# Patient Record
Sex: Male | Born: 1965 | Race: White | Hispanic: No | Marital: Single | Smoking: Never smoker
Health system: Southern US, Community
[De-identification: ages and names within clinical notes are randomized; demographics above are authoritative.]

## PROBLEM LIST (undated history)

## (undated) DIAGNOSIS — R079 Chest pain, unspecified: Secondary | ICD-10-CM

## (undated) DIAGNOSIS — J45909 Unspecified asthma, uncomplicated: Secondary | ICD-10-CM

## (undated) HISTORY — DX: Chest pain, unspecified: R07.9

## (undated) HISTORY — PX: OTHER SURGICAL HISTORY: SHX169

---

## 1999-08-17 ENCOUNTER — Encounter: Payer: Self-pay | Admitting: Family Medicine

## 1999-08-17 ENCOUNTER — Ambulatory Visit (HOSPITAL_COMMUNITY): Admission: RE | Admit: 1999-08-17 | Discharge: 1999-08-17 | Payer: Self-pay | Admitting: Family Medicine

## 2008-10-01 ENCOUNTER — Emergency Department (HOSPITAL_COMMUNITY): Admission: EM | Admit: 2008-10-01 | Discharge: 2008-10-01 | Payer: Self-pay | Admitting: Emergency Medicine

## 2010-11-08 LAB — COMPREHENSIVE METABOLIC PANEL
ALT: 44 U/L (ref 0–53)
AST: 32 U/L (ref 0–37)
CO2: 26 mEq/L (ref 19–32)
Chloride: 107 mEq/L (ref 96–112)
GFR calc Af Amer: >60 mL/min (ref >60)
GFR calc non Af Amer: >60 mL/min (ref >60)
Glucose, Bld: 134 mg/dL — ABNORMAL HIGH (ref 70–99)
Sodium: 142 mEq/L (ref 135–145)
Total Bilirubin: 0.9 mg/dL (ref 0.3–1.2)

## 2010-11-08 LAB — POCT I-STAT, CHEM 8
BUN: 7 mg/dL (ref 6–23)
Chloride: 108 mEq/L (ref 96–112)
Potassium: 3.9 mEq/L (ref 3.5–5.1)
Sodium: 142 mEq/L (ref 135–145)
TCO2: 23 mmol/L (ref 0–100)

## 2010-11-08 LAB — DIFFERENTIAL
Basophils Absolute: 0 10*3/uL (ref 0.0–0.1)
Basophils Relative: 0 % (ref 0–1)
Eosinophils Absolute: 0.1 10*3/uL (ref 0.0–0.7)
Eosinophils Relative: 1 % (ref 0–5)
Neutrophils Relative %: 83 % — ABNORMAL HIGH (ref 43–77)

## 2010-11-08 LAB — URINALYSIS, ROUTINE W REFLEX MICROSCOPIC
Glucose, UA: NEGATIVE mg/dL
Protein, ur: NEGATIVE mg/dL
Specific Gravity, Urine: 1.008 (ref 1.005–1.030)
pH: 5.5 (ref 5.0–8.0)

## 2010-11-08 LAB — URINE CULTURE: Culture: NO GROWTH

## 2010-11-08 LAB — CBC
Hemoglobin: 16.8 g/dL (ref 13.0–17.0)
MCV: 93.2 fL (ref 78.0–100.0)
RBC: 5.14 MIL/uL (ref 4.22–5.81)
WBC: 11.4 10*3/uL — ABNORMAL HIGH (ref 4.0–10.5)

## 2010-11-08 LAB — PROTIME-INR: Prothrombin Time: 13.8 seconds (ref 11.6–15.2)

## 2010-11-08 LAB — RAPID URINE DRUG SCREEN, HOSP PERFORMED
Cocaine: NOT DETECTED
Tetrahydrocannabinol: NOT DETECTED

## 2010-11-08 LAB — ABO/RH: ABO/RH(D): A POS
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
## 2013-08-06 ENCOUNTER — Encounter: Payer: Self-pay | Admitting: General Surgery

## 2013-08-06 ENCOUNTER — Encounter: Payer: Self-pay | Admitting: Cardiology

## 2013-08-06 ENCOUNTER — Ambulatory Visit (INDEPENDENT_AMBULATORY_CARE_PROVIDER_SITE_OTHER): Payer: BC Managed Care – PPO | Admitting: Cardiology

## 2013-08-06 VITALS — BP 106/80 | HR 80 | Ht 69.5 in | Wt 195.4 lb

## 2013-08-06 DIAGNOSIS — R079 Chest pain, unspecified: Secondary | ICD-10-CM

## 2013-08-06 MED ORDER — ASPIRIN 81 MG PO TABS: 81.0000 mg | ORAL_TABLET | Freq: Every day | ORAL | Status: DC

## 2013-08-06 NOTE — Progress Notes (Signed)
[address] [place], [place]  [address] Phone: [phone] Fax:  [phone]  Date:  08/06/2013   ID:  Charles Stone, DOB 08-20-1965, MRN [id number]  PCP:  No primary provider on file.  Cardiologist:  Armanda Magic, MD     History of Present Illness: Charles Stone is a 48 y.o. male with no cardiac history who presents today for evaluation of chest pain.  He says that the chest pain started about 6 weeks ago.  It occurs intermittently and starts over his left breast and radiates to the middle of his chest.  He has another pain that starts in his axilla and radiates into his upper abdomen.  The pain is sharp in nature and a shooting pain.  At other times the pain is like a toothache and then sometimes he gets very tight in his chest for a few minutes and then it eases off.  He has also had SOB with the chest pain and then sometimes when he exerts himself.  The chest pain occurs at rest or with exertion.  Nothing makes it better.  One episode the beginning of December he got diaphoretic with.   Wt Readings from Last 3 Encounters:  08/06/13 195 lb 6.4 oz (88.633 kg)     Past Medical History  Diagnosis Date    Current Outpatient Prescriptions  Medication Sig Dispense Refill  . amoxicillin (AMOXIL) 500 MG capsule       . cetirizine (ZYRTEC) 10 MG tablet Take 10 mg by mouth daily.      Marland Kitchen PROAIR HFA 108 (90 BASE) MCG/ACT inhaler        No current facility-administered medications for this visit.    Allergies:   No Known Allergies  Social History:  The patient  reports that he has never smoked. He does not have any smokeless tobacco history on file. He reports that he does not drink alcohol.   Family History:  The patient's family history includes Alcohol abuse in his father and mother; Cancer in his mother; Diabetes in his maternal grandmother; Heart attack in his maternal grandfather; Hypertension in his sister.   ROS:  Please see the history of present illness.      All  other systems reviewed and negative.   PHYSICAL EXAM: VS:  BP 106/80  Pulse 80  Ht 5' 9.5" (1.765 m)  Wt 195 lb 6.4 oz (88.633 kg)  BMI 28.45 kg/m2 Well nourished, well developed, in no acute distress HEENT: normal Neck: no JVD Cardiac:  normal S1, S2; RRR; no murmur Lungs:  clear to auscultation bilaterally, no wheezing, rhonchi or rales Abd: soft, nontender, no hepatomegaly Ext: no edema Skin: warm and dry Neuro:  CNs 2-12 intact, no focal abnormalities noted  EKG:  NSR with no ST changes     ASSESSMENT AND PLAN:  1. Chest pain with normal EKG.  CRF include age>40 and male sex.    - Stress myoview  - 2D echo to assess LVF  - start ASA 81mg  daily until stress test done  Followup with me PRN based on results of study  Signed, Armanda Magic, MD 08/06/2013 3:11 PM

## 2013-08-06 NOTE — Patient Instructions (Signed)
Your physician has recommended you make the following change in your medication: Start Aspirin 81 Mg 1 tablet daily   Your physician has requested that you have an echocardiogram. Echocardiography is a painless test that uses sound waves to create images of your heart. It provides your doctor with information about the size and shape of your heart and how well your heart's chambers and valves are working. This procedure takes approximately one hour. There are no restrictions for this procedure.  Your physician has requested that you have en exercise stress myoview. For further information please visit [email or web address]. Please follow instruction sheet, as given.  Your physician recommends that you schedule a follow-up appointment PRN

## 2013-08-24 ENCOUNTER — Ambulatory Visit (HOSPITAL_COMMUNITY): Payer: BC Managed Care – PPO | Attending: Cardiology | Admitting: Radiology

## 2013-08-24 ENCOUNTER — Ambulatory Visit (HOSPITAL_BASED_OUTPATIENT_CLINIC_OR_DEPARTMENT_OTHER): Payer: BC Managed Care – PPO | Admitting: Radiology

## 2013-08-24 ENCOUNTER — Other Ambulatory Visit: Payer: Self-pay

## 2013-08-24 VITALS — BP 130/85 | Ht 70.0 in | Wt 192.0 lb

## 2013-08-24 DIAGNOSIS — R072 Precordial pain: Secondary | ICD-10-CM

## 2013-08-24 DIAGNOSIS — R079 Chest pain, unspecified: Secondary | ICD-10-CM | POA: Insufficient documentation

## 2013-08-24 DIAGNOSIS — R0602 Shortness of breath: Secondary | ICD-10-CM

## 2013-08-24 MED ORDER — TECHNETIUM TC 99M SESTAMIBI GENERIC - CARDIOLITE
30.0000 | Freq: Once | INTRAVENOUS | Status: AC | PRN
  Administered 2013-08-24: 30 via INTRAVENOUS

## 2013-08-24 MED ORDER — TECHNETIUM TC 99M SESTAMIBI GENERIC - CARDIOLITE
10.0000 | Freq: Once | INTRAVENOUS | Status: AC | PRN
  Administered 2013-08-24: 10 via INTRAVENOUS

## 2013-08-24 NOTE — Progress Notes (Signed)
Echocardiogram performed.  

## 2013-08-24 NOTE — Progress Notes (Signed)
[facility] SITE [facility] NUCLE[facility] [address] [place], [place] [address] [phone]    Cardiology Nuclear Med Study  Cleda ClarksJames D Stone is a 48 y.o. male     MRN : [id number]     DOB: Aug 04, 1965  Procedure Date: 08/24/2013  Nuclear Med Background Indication for Stress Test:  Evaluation for Ischemia History:  Asthma and 08/24/13 ECHO, No CAD Hx Cardiac Risk Factors: Lipids  Symptoms:  Chest Pain and SOB   Nuclear Pre-Procedure Caffeine/Decaff Intake:  9:00pm bite of  chocolate chip cookie; 12 hrs ago NPO After: 9:00pm   Lungs:  clear O2 Sat: 96% on room air. IV 0.9% NS with Angio Cath:  22g  IV Site: R Antecubital x 1, tolerated well IV Started by:  Irean HongPatsy Edwards, RN  Chest Size (in):  40 Cup Size: n/a  Height: 5\' 10"  (1.778 m)  Weight:  192 lb (87.091 kg)  BMI:  Body mass index is 27.55 kg/(m^2). Tech Comments:  N/A    Nuclear Med Study 1 or 2 day study: 1 day  Stress Test Type:  Stress  Reading MD: N/A  Order Authorizing Provider:  Armanda Magicraci Turner, MD  Resting Radionuclide: Technetium 8724m Sestamibi  Resting Radionuclide Dose: 11.0 mCi   Stress Radionuclide:  Technetium 1124m Sestamibi  Stress Radionuclide Dose: 33.0 mCi           Stress Protocol Rest HR: 64 Stress HR: 157  Rest BP: 130/85 Stress BP: 144/74  Exercise Time (min): 8:00 METS: 10.10   Predicted Max HR: 171 bpm % Max HR: 91.81 bpm Rate Pressure Product: [id number]   Dose of Adenosine (mg):  n/a Dose of Lexiscan: n/a mg  Dose of Atropine (mg): n/a Dose of Dobutamine: n/a mcg/kg/min (at max HR)  Stress Test Technologist: Milana NaSabrina Williams, EMT-P  Nuclear Technologist:  Domenic PoliteStephen Carbone, CNMT     Rest Procedure:  Myocardial perfusion imaging was performed at rest 45 minutes following the intravenous administration of Technetium 3124m Sestamibi. Rest ECG: NSR - Normal EKG  Stress Procedure:  The patient exercised on the treadmill utilizing the Bruce Protocol for 8:00 minutes. The patient stopped due to fatigue,  sob,  and denied any chest pain.  Technetium 3724m Sestamibi was injected at peak exercise and myocardial perfusion imaging was performed after a brief delay. Stress ECG: No significant change from baseline ECG  QPS Raw Data Images:  Normal; no motion artifact; normal heart/lung ratio. Stress Images:  Normal homogeneous uptake in all areas of the myocardium. Rest Images:  Normal homogeneous uptake in all areas of the myocardium. Subtraction (SDS):  No evidence of ischemia. Transient Ischemic Dilatation (Normal <1.22):  0.90 Lung/Heart Ratio (Normal <0.45):  0.37  Quantitative Gated Spect Images QGS EDV:  79 ml QGS ESV:  33 ml  Impression Exercise Capacity:  Good exercise capacity. BP Response:  Normal blood pressure response. Clinical Symptoms:  There is dyspnea. ECG Impression:  No significant ST segment change suggestive of ischemia. Comparison with Prior Nuclear Study: No images to compare  Overall Impression:  Normal stress nuclear study.  LV Ejection Fraction: 59%.  LV Wall Motion:  NL LV Function; NL Wall Motion   Tobias AlexanderELSON, Iwalani Templeton, Rexene EdisonH 08/24/2013

## 2013-08-25 ENCOUNTER — Telehealth: Payer: Self-pay | Admitting: General Surgery

## 2013-08-25 MED ORDER — PANTOPRAZOLE SODIUM 40 MG PO TBEC: 40.0000 mg | DELAYED_RELEASE_TABLET | Freq: Every day | ORAL | Status: DC

## 2013-08-25 NOTE — Telephone Encounter (Signed)
Pt is aware and med list updated. New Rx Sent in for pt and pt was also scheduled for appt.

## 2013-08-25 NOTE — Telephone Encounter (Signed)
Message copied by Nita SellsORSON, Devereaux Grayson L on Wed Aug 25, 2013  5:34 PM ------      Message from: Armanda MagicURNER, TRACI R      Created: Wed Aug 25, 2013  9:40 AM       Please have him start Protonix 40mg  daily and stop ASA and followup with me in 4 weeks ------

## 2013-09-22 ENCOUNTER — Ambulatory Visit: Payer: BC Managed Care – PPO | Admitting: Cardiology

## 2017-12-10 ENCOUNTER — Telehealth: Payer: Self-pay

## 2017-12-11 NOTE — Telephone Encounter (Signed)
Error

## 2018-05-01 ENCOUNTER — Emergency Department (HOSPITAL_BASED_OUTPATIENT_CLINIC_OR_DEPARTMENT_OTHER): Payer: BLUE CROSS/BLUE SHIELD

## 2018-05-01 ENCOUNTER — Inpatient Hospital Stay (HOSPITAL_COMMUNITY): Payer: BLUE CROSS/BLUE SHIELD

## 2018-05-01 ENCOUNTER — Encounter (HOSPITAL_BASED_OUTPATIENT_CLINIC_OR_DEPARTMENT_OTHER): Payer: Self-pay | Admitting: Emergency Medicine

## 2018-05-01 ENCOUNTER — Other Ambulatory Visit: Payer: Self-pay

## 2018-05-01 ENCOUNTER — Inpatient Hospital Stay (HOSPITAL_BASED_OUTPATIENT_CLINIC_OR_DEPARTMENT_OTHER)
Admission: EM | Admit: 2018-05-01 | Discharge: 2018-05-03 | DRG: 440 | Disposition: A | Discharge status: Home / Self Care | Payer: BLUE CROSS/BLUE SHIELD | Attending: Family Medicine | Admitting: Family Medicine

## 2018-05-01 DIAGNOSIS — R748 Abnormal levels of other serum enzymes: Secondary | ICD-10-CM | POA: Diagnosis present

## 2018-05-01 DIAGNOSIS — K859 Acute pancreatitis without necrosis or infection, unspecified: Secondary | ICD-10-CM | POA: Diagnosis present

## 2018-05-01 DIAGNOSIS — E876 Hypokalemia: Secondary | ICD-10-CM | POA: Diagnosis present

## 2018-05-01 DIAGNOSIS — Z811 Family history of alcohol abuse and dependence: Secondary | ICD-10-CM | POA: Diagnosis not present

## 2018-05-01 DIAGNOSIS — Z8249 Family history of ischemic heart disease and other diseases of the circulatory system: Secondary | ICD-10-CM | POA: Diagnosis not present

## 2018-05-01 DIAGNOSIS — F1090 Alcohol use, unspecified, uncomplicated: Secondary | ICD-10-CM

## 2018-05-01 DIAGNOSIS — K852 Alcohol induced acute pancreatitis without necrosis or infection: Principal | ICD-10-CM | POA: Diagnosis present

## 2018-05-01 DIAGNOSIS — Z7289 Other problems related to lifestyle: Secondary | ICD-10-CM

## 2018-05-01 DIAGNOSIS — R7989 Other specified abnormal findings of blood chemistry: Secondary | ICD-10-CM

## 2018-05-01 DIAGNOSIS — R030 Elevated blood-pressure reading, without diagnosis of hypertension: Secondary | ICD-10-CM | POA: Diagnosis present

## 2018-05-01 DIAGNOSIS — R945 Abnormal results of liver function studies: Secondary | ICD-10-CM

## 2018-05-01 DIAGNOSIS — R911 Solitary pulmonary nodule: Secondary | ICD-10-CM | POA: Diagnosis present

## 2018-05-01 DIAGNOSIS — Z789 Other specified health status: Secondary | ICD-10-CM

## 2018-05-01 DIAGNOSIS — F1010 Alcohol abuse, uncomplicated: Secondary | ICD-10-CM | POA: Diagnosis present

## 2018-05-01 LAB — COMPREHENSIVE METABOLIC PANEL
ALBUMIN: 4 g/dL (ref 3.5–5.0)
ALT: 45 U/L — ABNORMAL HIGH (ref 0–44)
AST: 79 U/L — AB (ref 15–41)
Alkaline Phosphatase: 62 U/L (ref 38–126)
Anion gap: 9 (ref 5–15)
BUN: 6 mg/dL (ref 6–20)
CHLORIDE: 106 mmol/L (ref 98–111)
CO2: 23 mmol/L (ref 22–32)
Calcium: 9.4 mg/dL (ref 8.9–10.3)
Creatinine, Ser: 1.12 mg/dL (ref 0.61–1.24)
GFR calc Af Amer: >60 mL/min (ref >60)
GFR calc non Af Amer: >60 mL/min (ref >60)
Glucose, Bld: 124 mg/dL — ABNORMAL HIGH (ref 70–99)
POTASSIUM: 3.4 mmol/L — AB (ref 3.5–5.1)
SODIUM: 138 mmol/L (ref 135–145)
Total Bilirubin: 0.6 mg/dL (ref 0.3–1.2)
Total Protein: 7.3 g/dL (ref 6.5–8.1)

## 2018-05-01 LAB — CBC
HEMATOCRIT: 42.6 % (ref 39.0–52.0)
Hemoglobin: 15.1 g/dL (ref 13.0–17.0)
MCH: 35.9 pg — ABNORMAL HIGH (ref 26.0–34.0)
MCHC: 35.4 g/dL (ref 30.0–36.0)
MCV: 101.2 fL — AB (ref 78.0–100.0)
Platelets: 232 10*3/uL (ref 150–400)
RBC: 4.21 MIL/uL — ABNORMAL LOW (ref 4.22–5.81)
RDW: 12.8 % (ref 11.5–15.5)
WBC: 10.2 10*3/uL (ref 4.0–10.5)

## 2018-05-01 LAB — URINALYSIS, ROUTINE W REFLEX MICROSCOPIC
Bilirubin Urine: NEGATIVE
Glucose, UA: NEGATIVE mg/dL
Hgb urine dipstick: NEGATIVE
Ketones, ur: NEGATIVE mg/dL
Leukocytes, UA: NEGATIVE
Nitrite: NEGATIVE
PROTEIN: NEGATIVE mg/dL
Specific Gravity, Urine: >1.03 — ABNORMAL HIGH (ref 1.005–1.030)
pH: 5.5 (ref 5.0–8.0)

## 2018-05-01 LAB — LIPASE, BLOOD: Lipase: 1232 U/L — ABNORMAL HIGH (ref 11–51)

## 2018-05-01 MED ORDER — LORAZEPAM 1 MG PO TABS: 1.0000 mg | ORAL_TABLET | Freq: Four times a day (QID) | ORAL | Status: DC | PRN

## 2018-05-01 MED ORDER — FAMOTIDINE IN NACL 20-0.9 MG/50ML-% IV SOLN
20.0000 mg | Freq: Two times a day (BID) | INTRAVENOUS | Status: DC
  Administered 2018-05-01 – 2018-05-03 (×5): 20 mg via INTRAVENOUS
  Filled 2018-05-01 (×5): qty 50

## 2018-05-01 MED ORDER — ONDANSETRON HCL 4 MG/2ML IJ SOLN
4.0000 mg | Freq: Once | INTRAMUSCULAR | Status: AC
  Administered 2018-05-01: 4 mg via INTRAVENOUS
  Filled 2018-05-01: qty 2

## 2018-05-01 MED ORDER — POTASSIUM CHLORIDE CRYS ER 20 MEQ PO TBCR
40.0000 meq | EXTENDED_RELEASE_TABLET | Freq: Once | ORAL | Status: AC
  Administered 2018-05-01: 40 meq via ORAL
  Filled 2018-05-01: qty 2

## 2018-05-01 MED ORDER — IOPAMIDOL (ISOVUE-300) INJECTION 61%
100.0000 mL | Freq: Once | INTRAVENOUS | Status: AC | PRN
  Administered 2018-05-01: 100 mL via INTRAVENOUS

## 2018-05-01 MED ORDER — ACETAMINOPHEN 325 MG PO TABS: 650.0000 mg | ORAL_TABLET | ORAL | Status: DC | PRN

## 2018-05-01 MED ORDER — VITAMIN B-1 100 MG PO TABS
100.0000 mg | ORAL_TABLET | Freq: Every day | ORAL | Status: DC
  Administered 2018-05-01 – 2018-05-03 (×3): 100 mg via ORAL
  Filled 2018-05-01 (×3): qty 1

## 2018-05-01 MED ORDER — ALBUTEROL SULFATE (2.5 MG/3ML) 0.083% IN NEBU: 3.0000 mL | INHALATION_SOLUTION | RESPIRATORY_TRACT | Status: DC | PRN

## 2018-05-01 MED ORDER — LORAZEPAM 2 MG/ML IJ SOLN: 1.0000 mg | Freq: Four times a day (QID) | INTRAMUSCULAR | Status: DC | PRN

## 2018-05-01 MED ORDER — SODIUM CHLORIDE 0.9 % IV BOLUS
1000.0000 mL | Freq: Once | INTRAVENOUS | Status: AC
  Administered 2018-05-01: 1000 mL via INTRAVENOUS

## 2018-05-01 MED ORDER — MORPHINE SULFATE (PF) 4 MG/ML IV SOLN
4.0000 mg | Freq: Once | INTRAVENOUS | Status: AC
  Administered 2018-05-01: 4 mg via INTRAVENOUS
  Filled 2018-05-01: qty 1

## 2018-05-01 MED ORDER — ENOXAPARIN SODIUM 40 MG/0.4ML ~~LOC~~ SOLN
40.0000 mg | SUBCUTANEOUS | Status: DC
  Administered 2018-05-01 – 2018-05-02 (×2): 40 mg via SUBCUTANEOUS
  Filled 2018-05-01 (×2): qty 0.4

## 2018-05-01 MED ORDER — FOLIC ACID 1 MG PO TABS
1.0000 mg | ORAL_TABLET | Freq: Every day | ORAL | Status: DC
  Administered 2018-05-01 – 2018-05-03 (×3): 1 mg via ORAL
  Filled 2018-05-01 (×3): qty 1

## 2018-05-01 MED ORDER — THIAMINE HCL 100 MG/ML IJ SOLN: 100.0000 mg | Freq: Every day | INTRAMUSCULAR | Status: DC

## 2018-05-01 MED ORDER — SODIUM CHLORIDE 0.9 % IV SOLN
INTRAVENOUS | Status: DC
  Administered 2018-05-01 – 2018-05-03 (×5): via INTRAVENOUS

## 2018-05-01 MED ORDER — INFLUENZA VAC SPLIT QUAD 0.5 ML IM SUSY: 0.5000 mL | PREFILLED_SYRINGE | INTRAMUSCULAR | Status: DC

## 2018-05-01 MED ORDER — MORPHINE SULFATE (PF) 2 MG/ML IV SOLN
1.0000 mg | INTRAVENOUS | Status: DC | PRN
  Administered 2018-05-01 – 2018-05-02 (×4): 2 mg via INTRAVENOUS
  Filled 2018-05-01 (×4): qty 1

## 2018-05-01 MED ORDER — HYDRALAZINE HCL 20 MG/ML IJ SOLN: 2.0000 mg | Freq: Four times a day (QID) | INTRAMUSCULAR | Status: DC | PRN

## 2018-05-01 MED ORDER — PANTOPRAZOLE SODIUM 40 MG PO TBEC
40.0000 mg | DELAYED_RELEASE_TABLET | Freq: Every day | ORAL | Status: DC
  Administered 2018-05-01 – 2018-05-03 (×3): 40 mg via ORAL
  Filled 2018-05-01 (×3): qty 1

## 2018-05-01 MED ORDER — ADULT MULTIVITAMIN W/MINERALS CH
1.0000 | ORAL_TABLET | Freq: Every day | ORAL | Status: DC
  Administered 2018-05-01 – 2018-05-03 (×3): 1 via ORAL
  Filled 2018-05-01 (×3): qty 1

## 2018-05-01 MED ORDER — ONDANSETRON HCL 4 MG/2ML IJ SOLN: 4.0000 mg | Freq: Four times a day (QID) | INTRAMUSCULAR | Status: DC | PRN

## 2018-05-01 MED ORDER — HYDROMORPHONE HCL 1 MG/ML IJ SOLN
1.0000 mg | INTRAMUSCULAR | Status: DC | PRN
  Administered 2018-05-01 (×2): 1 mg via INTRAVENOUS
  Filled 2018-05-01 (×2): qty 1

## 2018-05-01 MED ORDER — TRAMADOL HCL 50 MG PO TABS
50.0000 mg | ORAL_TABLET | Freq: Four times a day (QID) | ORAL | Status: DC | PRN
  Administered 2018-05-01 – 2018-05-03 (×5): 50 mg via ORAL
  Filled 2018-05-01 (×5): qty 1

## 2018-05-01 NOTE — ED Notes (Signed)
Patient transported to CT 
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
## 2018-05-01 NOTE — ED Notes (Signed)
ED Provider at bedside. 

## 2018-05-01 NOTE — Progress Notes (Signed)
52 year old gentleman,presenting to [facility] for abdominal pain. He was found to have elevated lipase and is requiring admission for acute pancreatitis, probably from alcohol use. VITALS WNL. Accepted to [facility].   Kathlen Mody, MD

## 2018-05-01 NOTE — ED Triage Notes (Signed)
Pt c/o 10/10 RU abd pain radiating to left lower other way to the back, pt having nausea, vomiting diarrhea since last Sunday.

## 2018-05-01 NOTE — ED Provider Notes (Signed)
[facility] EMERGENCY DEPARTMENT Provider Note   CSN: [id number] Arrival date & time: 05/01/18  0865     History   Chief Complaint Chief Complaint  Patient presents with  . Abdominal Pain    HPI Charles Stone is a 52 y.o. male.  HPI  Presents with epigastric pain shooting down towards LLQ and back No hx of similar pain Severe pain with standing, feels lightheaded Nausea and vomiting, haven't been able to keep anything down this AM, threw up twice, sun-mon couldn't keep anything down Also having diarrhea since Sunday, took some immodium No black or bloody stools Monday had pain like this but it went away, but came back yesterday afternoon and worsening, woke up at 3AM with pain Watery stools No fevers, no urinary symptoms, no hx of kidney stones or diverticulitis Severe 10/10 pain Went to beach last week and drank a lot of etoh, also drank last night.  Past Medical History:  Diagnosis Date  . Chest pain     Patient Active Problem List   Diagnosis Date Noted  . Acute pancreatitis 05/01/2018  . Hypokalemia 05/01/2018  . Alcohol use 05/01/2018  . Chest pain 08/06/2013    Past Surgical History:  Procedure Laterality Date  . right foot surgery  1995 & 1996  . spine sx  2001 and 07/2012        Home Medications    Prior to Admission medications   Medication Sig Start Date End Date Taking? Authorizing Provider  cetirizine (ZYRTEC) 10 MG tablet Take 10 mg by mouth daily.   Yes [provider]  PROAIR HFA 108 (90 BASE) MCG/ACT inhaler Inhale 1 puff into the lungs every 6 (six) hours as needed for wheezing or shortness of breath.  07/27/13  Yes [provider]    Family History Family History  Problem Relation Age of Onset  . Cancer Mother   . Alcohol abuse Mother   . Alcohol abuse Father   . Hypertension Sister   . Diabetes Maternal Grandmother   . Heart attack Maternal Grandfather     Social History Social History   Tobacco  Use  . Smoking status: Never Smoker  . Smokeless tobacco: Never Used  Substance Use Topics  . Alcohol use: Yes    Alcohol/week: 6.0 standard drinks    Types: 2 Cans of beer, 4 Shots of liquor per week  . Drug use: Never     Allergies   Patient has no known allergies.   Review of Systems Review of Systems  Constitutional: Negative for fever.  HENT: Negative for sore throat.   Eyes: Negative for visual disturbance.  Respiratory: Negative for shortness of breath.   Cardiovascular: Negative for chest pain.  Gastrointestinal: Positive for abdominal pain, diarrhea, nausea and vomiting. Negative for constipation.  Genitourinary: Negative for difficulty urinating and dysuria.  Musculoskeletal: Negative for back pain and neck stiffness.  Skin: Negative for rash.  Neurological: Positive for light-headedness. Negative for syncope and headaches.     Physical Exam Updated Vital Signs BP (!) 144/88   Pulse 82   Temp 98.8 F (37.1 C) (Oral)   Resp 18   Ht 5\' 9"  (1.753 m)   Wt 83.9 kg   SpO2 99%   BMI 27.32 kg/m   Physical Exam  Constitutional: He is oriented to person, place, and time. He appears well-developed and well-nourished. No distress.  HENT:  Head: Normocephalic and atraumatic.  Eyes: Conjunctivae and EOM are normal.  Neck: Normal range  of motion.  Cardiovascular: Normal rate, regular rhythm, normal heart sounds and intact distal pulses. Exam reveals no gallop and no friction rub.  No murmur heard. Pulmonary/Chest: Effort normal and breath sounds normal. No respiratory distress. He has no wheezes. He has no rales.  Abdominal: Soft. He exhibits no distension. There is tenderness in the epigastric area and left lower quadrant. There is no guarding.  Musculoskeletal: He exhibits no edema.  Neurological: He is alert and oriented to person, place, and time.  Skin: Skin is warm and dry. He is not diaphoretic.  Nursing note and vitals reviewed.    ED Treatments /  Results  Labs (all labs ordered are listed, but only abnormal results are displayed) Labs Reviewed  LIPASE, BLOOD - Abnormal; Notable for the following components:      Result Value   Lipase 1,232 (*)    All other components within normal limits  COMPREHENSIVE METABOLIC PANEL - Abnormal; Notable for the following components:   Potassium 3.4 (*)    Glucose, Bld 124 (*)    AST 79 (*)    ALT 45 (*)    All other components within normal limits  CBC - Abnormal; Notable for the following components:   RBC 4.21 (*)    MCV 101.2 (*)    MCH 35.9 (*)    All other components within normal limits  URINALYSIS, ROUTINE W REFLEX MICROSCOPIC - Abnormal; Notable for the following components:   APPearance HAZY (*)    Specific Gravity, Urine >1.030 (*)    All other components within normal limits  HIV ANTIBODY (ROUTINE TESTING W REFLEX)  LIPASE, BLOOD  COMPREHENSIVE METABOLIC PANEL    EKG None  Radiology Ct Abdomen Pelvis W Contrast  Result Date: 05/01/2018 CLINICAL DATA:  Abdominal pain.  Evaluate for diverticulitis. EXAM: CT ABDOMEN AND PELVIS WITH CONTRAST TECHNIQUE: Multidetector CT imaging of the abdomen and pelvis was performed using the standard protocol following bolus administration of intravenous contrast. CONTRAST:  ISOVUE-300 IOPAMIDOL (ISOVUE-300) INJECTION 61% COMPARISON:  10/01/2008 FINDINGS: Lower chest: Limited visualization of the lower thorax demonstrates a punctate (approximately 2 mm nodule within the imaged left lower lobe (image 3, series 6), potentially obscured by atelectasis on the 09/2008 examination. No pleural effusion. No focal airspace opacities. Normal heart size.  No pericardial effusion. Hepatobiliary: There is diffuse decreased attenuation of the hepatic parenchyma on this postcontrast examination with suspected mild nodularity hepatic contour. No discrete hepatic lesions. Normal appearance of the gallbladder given degree distention. No radiopaque gallstones. No  intra or extrahepatic biliary ductal dilatation. No ascites. Pancreas: There is ill-defined stranding surrounding the pancreas most conspicuous about the mid body and pancreatic tail (representative images 24 and 28, series 2). There is apparent homogeneous enhancement of the pancreatic parenchyma without evidence of pancreatic mass or pancreatic ductal dilatation on this non pancreatic protocol CT. No discrete definable/drainable fluid collection. The adjacent vasculature appears patent. Spleen: Normal appearance of the spleen Adrenals/Urinary Tract: There is homogeneous enhancement and excretion of the bilateral kidneys. No definite renal stones on this postcontrast examination. No discrete renal lesions. No urine obstruction or perinephric stranding. Normal appearance of the bilateral adrenal glands. Normal appearance of the urinary bladder given underdistention. Stomach/Bowel: Bowel is normal in course and caliber without wall thickening or evidence of enteric obstruction. Normal appearance of the terminal ileum and appendix. Small hiatal hernia. No pneumoperitoneum, pneumatosis or portal venous gas. Vascular/Lymphatic: Scattered minimal amount of atherosclerotic plaque with a normal caliber abdominal aorta. The major branch  vessels appear patent on this non CTA examination. Scattered retroperitoneal, porta hepatis and mesenteric lymph nodes are numerous though individually not enlarged by size criteria, presumably reactive in etiology. No retroperitoneal, mesenteric, pelvic or inguinal lymphadenopathy. Reproductive: Dystrophic calcifications within the prostate gland. No free fluid in the pelvic cul-de-sac. Other: Small left-sided mesenteric fat containing direct inguinal hernia. Small mesenteric fat containing periumbilical hernia. Musculoskeletal: No acute or aggressive osseous abnormalities. Stigmata of DISH within the thoracic spine. A presumed [place] is seen within the intratrochanteric aspect of the left  femur, unchanged compared to the 20/10 examination. IMPRESSION: 1. Findings suggestive of acute uncomplicated pancreatitis. No evidence of pseudocyst formation or definable/drainable abscess. No radiopaque gallstones or intra/extrahepatic biliary ductal dilatation. 2. Punctate (2 mm) left lower lobe pulmonary nodule. No follow-up needed if patient is low-risk. Non-contrast chest CT can be considered in 12 months if patient is high-risk. This recommendation follows the consensus statement: Guidelines for Management of Incidental Pulmonary Nodules Detected on CT Images: From the Fleischner Society 2017; Radiology 2017; 284:228-243. Electronically Signed   By: Simonne Come M.D.   On: 05/01/2018 07:54   US Abdomen Limited Ruq  Result Date: 05/01/2018 CLINICAL DATA:  Elevated liver enzymes. EXAM: ULTRASOUND ABDOMEN LIMITED RIGHT UPPER QUADRANT COMPARISON:  Body CT 05/01/2018 FINDINGS: Gallbladder: No gallstones or wall thickening visualized. No sonographic Murphy sign noted by sonographer. Common bile duct: Diameter: Up to 6 mm Liver: No focal lesion identified. Within normal limits in parenchymal echogenicity. Portal vein is patent on color Doppler imaging with normal direction of blood flow towards the liver. IMPRESSION: No evidence of cholelithiasis or acute cholecystitis. Normal echogenicity of the liver. Mild prominence of the intra and extrahepatic biliary ductal system. Electronically Signed   By: Ted Mcalpine M.D.   On: 05/01/2018 15:02    Procedures Procedures (including critical care time)  Medications Ordered in ED Medications  albuterol (PROVENTIL) (2.5 MG/3ML) 0.083% nebulizer solution 3 mL (has no administration in time range)  enoxaparin (LOVENOX) injection 40 mg (has no administration in time range)  0.9 %  sodium chloride infusion ( Intravenous New Bag/Given 05/01/18 1222)  ondansetron (ZOFRAN) injection 4 mg (has no administration in time range)  morphine 2 MG/ML injection 1-2 mg (2  mg Intravenous Given 05/01/18 1220)  traMADol (ULTRAM) tablet 50 mg (50 mg Oral Given 05/01/18 1400)  acetaminophen (TYLENOL) tablet 650 mg (has no administration in time range)  LORazepam (ATIVAN) tablet 1 mg (has no administration in time range)    Or  LORazepam (ATIVAN) injection 1 mg (has no administration in time range)  thiamine (VITAMIN B-1) tablet 100 mg (100 mg Oral Given 05/01/18 1400)    Or  thiamine (B-1) injection 100 mg ( Intravenous See Alternative 05/01/18 1400)  folic acid (FOLVITE) tablet 1 mg (1 mg Oral Given 05/01/18 1400)  multivitamin with minerals tablet 1 tablet (1 tablet Oral Given 05/01/18 1400)  famotidine (PEPCID) IVPB 20 mg premix (20 mg Intravenous New Bag/Given 05/01/18 1402)  pantoprazole (PROTONIX) EC tablet 40 mg (40 mg Oral Given 05/01/18 1400)  hydrALAZINE (APRESOLINE) injection 2 mg (has no administration in time range)  HYDROmorphone (DILAUDID) injection 1 mg (1 mg Intravenous Given 05/01/18 1513)  Influenza vac split quadrivalent PF (FLUARIX) injection 0.5 mL (has no administration in time range)  sodium chloride 0.9 % bolus 1,000 mL ( Intravenous Stopped 05/01/18 0845)  morphine 4 MG/ML injection 4 mg (4 mg Intravenous Given 05/01/18 0745)  ondansetron (ZOFRAN) injection 4 mg (4 mg Intravenous Given 05/01/18  1610)  iopamidol (ISOVUE-300) 61 % injection 100 mL (100 mLs Intravenous Contrast Given 05/01/18 0732)  morphine 4 MG/ML injection 4 mg (4 mg Intravenous Given 05/01/18 0909)  potassium chloride SA (K-DUR,KLOR-CON) CR tablet 40 mEq (40 mEq Oral Given 05/01/18 1400)     Initial Impression / Assessment and Plan / ED Course  I have reviewed the triage vital signs and the nursing notes.  Pertinent labs & imaging results that were available during my care of the patient were reviewed by me and considered in my medical decision making (see chart for details).     52 year old male presents with concern for epigastric and left sided abdominal pain.  Differential  diagnosis include pancreatitis, diverticulitis, bowel obstruction.  CT abdomen pelvis obtained showing no signs of uncomplicated pancreatitis.  Patient's lipase is greater than 1000.  Symptoms overall are not consistent with GB as cause and given recent alcohol use, suspect alcohol induced pancreatitis.  Patient has no history of this in the past admit for pain control and further evaluation.  Final Clinical Impressions(s) / ED Diagnoses   Final diagnoses:  Alcohol-induced acute pancreatitis without infection or necrosis    ED Discharge Orders    None       Alvira Monday, MD 05/01/18 1810

## 2018-05-02 LAB — COMPREHENSIVE METABOLIC PANEL
ALK PHOS: 54 U/L (ref 38–126)
ALT: 29 U/L (ref 0–44)
AST: 38 U/L (ref 15–41)
Albumin: 3.2 g/dL — ABNORMAL LOW (ref 3.5–5.0)
Anion gap: 8 (ref 5–15)
BUN: 6 mg/dL (ref 6–20)
CO2: 26 mmol/L (ref 22–32)
CREATININE: 1.03 mg/dL (ref 0.61–1.24)
Calcium: 8 mg/dL — ABNORMAL LOW (ref 8.9–10.3)
Chloride: 105 mmol/L (ref 98–111)
GFR calc Af Amer: >60 mL/min (ref >60)
GFR calc non Af Amer: >60 mL/min (ref >60)
GLUCOSE: 123 mg/dL — AB (ref 70–99)
Potassium: 4 mmol/L (ref 3.5–5.1)
SODIUM: 139 mmol/L (ref 135–145)
Total Bilirubin: 1.5 mg/dL — ABNORMAL HIGH (ref 0.3–1.2)
Total Protein: 6.2 g/dL — ABNORMAL LOW (ref 6.5–8.1)

## 2018-05-02 LAB — HIV ANTIBODY (ROUTINE TESTING W REFLEX): HIV SCREEN 4TH GENERATION: NONREACTIVE

## 2018-05-02 LAB — LIPASE, BLOOD: Lipase: 356 U/L — ABNORMAL HIGH (ref 11–51)

## 2018-05-02 NOTE — Progress Notes (Signed)
PROGRESS NOTE [facility]   Charles Stone   ZOX:[id number] DOB: [birth year]  DOA: 05/01/2018 PCP: Patient, No Pcp Per   Brief Narrative:  Charles Stone is a 52 year old male with no significant medical history other than alcohol use presented to the emergency department complaining of abdominal pain for to 3 days prior to admission.  Upon ED evaluation lipase was found to be 1232, CT of the abdomen/pelvis show findings suggestive of acute pancreatitis.  Creatinine 1.2, potassium 3.4 and slight elevated BP.  AST 79 and ALT 45.  Patient was admitted with working diagnosis of acute pancreatitis.  Subjective: Patient seen and examined, report abdominal pain is much improved.  Denies nausea and vomiting.  No acute events overnight.  Patient reported feeling hungry.  Assessment & Plan: Acute pancreatitis Suspect to be alcohol related, right upper quadrant ultrasound with no gallbladder pathology.  Patient report binge drinking last weekend and symptoms started about 48 hours after.  Patient been treated conservatively with IV fluids, pain control and n.p.o.  Lipase trending down, today 356.  LFTs trending down.  Will start on clear liquid diet.  Continue pain control  Alcohol abuse Never been through alcohol withdrawal, drinks 1-2 beers a day.  Continue CIWA.  No signs of withdrawal at this point.  Elevated liver enzymes  Likely alcohol related.  LFTs trending down.  Monitor CMP in a.m.  Elevated pressure without diagnosis of hypertension Felt to be related to abdominal pain, blood pressure improved without any interventions so far.  Hypokalemia Replace as needed.  Check labs in a.m.  DVT prophylaxis: Lovenox Code Status: Full code Family Communication: None at bedside Disposition Plan: Home in a.m. if tolerates of diet.  Consultants:   None  Procedures:   None   Antimicrobials:  None   Objective: Vitals:   05/01/18 1201 05/01/18 2046 05/02/18 0549 05/02/18 1412  BP:  (!) 144/88 (!) 157/79 132/87 133/77  Pulse: 82 84 95 97  Resp: 18 (!) 21 (!) 21 18  Temp: 98.8 F (37.1 C) 99.9 F (37.7 C) 99.4 F (37.4 C) 98.6 F (37 C)  TempSrc: Oral   Oral  SpO2: 99% 96% 97% 97%  Weight:      Height:        Intake/Output Summary (Last 24 hours) at 05/02/2018 1650 Last data filed at 05/02/2018 1051 Gross per 24 hour  Intake 240 ml  Output -  Net 240 ml   Filed Weights   05/01/18 0658 05/01/18 1001  Weight: 83.9 kg 83.9 kg    Examination:  General exam: Appears calm and comfortable  HEENT: AC/AT, PERRLA, OP moist and clear Respiratory system: Clear to auscultation. No wheezes,crackle or rhonchi Cardiovascular system: S1 & S2 heard, RRR. No JVD, murmurs, rubs or gallops Gastrointestinal system: Soft, mild distended mild epigastric tenderness, positive bowel sounds, tympanic Central nervous system: Alert and oriented. No focal neurological deficits. Extremities: No pedal edema. Symmetric, strength 5/5   Skin: No rashes, lesions or ulcers Psychiatry:  Mood & affect appropriate.    Data Reviewed: I have personally reviewed following labs and imaging studies  CBC: Recent Labs  Lab 05/01/18 0706  WBC 10.2  HGB 15.1  HCT 42.6  MCV 101.2*  PLT 232   Basic Metabolic Panel: Recent Labs  Lab 05/01/18 0706 05/02/18 0540  NA 138 139  K 3.4* 4.0  CL 106 105  CO2 23 26  GLUCOSE 124* 123*  BUN 6 6  CREATININE 1.12 1.03  CALCIUM 9.4 8.0*  GFR: Estimated Creatinine Clearance: 82 mL/min (by C-G formula based on SCr of 1.03 mg/dL). Liver Function Tests: Recent Labs  Lab 05/01/18 0706 05/02/18 0540  AST 79* 38  ALT 45* 29  ALKPHOS 62 54  BILITOT 0.6 1.5*  PROT 7.3 6.2*  ALBUMIN 4.0 3.2*   Recent Labs  Lab 05/01/18 0706 05/02/18 0540  LIPASE 1,232* 356*   No results for input(s): AMMONIA in the last 168 hours. Coagulation Profile: No results for input(s): INR, PROTIME in the last 168 hours. Cardiac Enzymes: No results for  input(s): CKTOTAL, CKMB, CKMBINDEX, TROPONINI in the last 168 hours. BNP (last 3 results) No results for input(s): PROBNP in the last 8760 hours. HbA1C: No results for input(s): HGBA1C in the last 72 hours. CBG: No results for input(s): GLUCAP in the last 168 hours. Lipid Profile: No results for input(s): CHOL, HDL, LDLCALC, TRIG, CHOLHDL, LDLDIRECT in the last 72 hours. Thyroid Function Tests: No results for input(s): TSH, T4TOTAL, FREET4, T3FREE, THYROIDAB in the last 72 hours. Anemia Panel: No results for input(s): VITAMINB12, FOLATE, FERRITIN, TIBC, IRON, RETICCTPCT in the last 72 hours. Sepsis Labs: No results for input(s): PROCALCITON, LATICACIDVEN in the last 168 hours.  No results found for this or any previous visit (from the past 240 hour(s)).    Radiology Studies: Ct Abdomen Pelvis W Contrast  Result Date: 05/01/2018 CLINICAL DATA:  Abdominal pain.  Evaluate for diverticulitis. EXAM: CT ABDOMEN AND PELVIS WITH CONTRAST TECHNIQUE: Multidetector CT imaging of the abdomen and pelvis was performed using the standard protocol following bolus administration of intravenous contrast. CONTRAST:  ISOVUE-300 IOPAMIDOL (ISOVUE-300) INJECTION 61% COMPARISON:  10/01/2008 FINDINGS: Lower chest: Limited visualization of the lower thorax demonstrates a punctate (approximately 2 mm nodule within the imaged left lower lobe (image 3, series 6), potentially obscured by atelectasis on the 09/2008 examination. No pleural effusion. No focal airspace opacities. Normal heart size.  No pericardial effusion. Hepatobiliary: There is diffuse decreased attenuation of the hepatic parenchyma on this postcontrast examination with suspected mild nodularity hepatic contour. No discrete hepatic lesions. Normal appearance of the gallbladder given degree distention. No radiopaque gallstones. No intra or extrahepatic biliary ductal dilatation. No ascites. Pancreas: There is ill-defined stranding surrounding the  pancreas most conspicuous about the mid body and pancreatic tail (representative images 24 and 28, series 2). There is apparent homogeneous enhancement of the pancreatic parenchyma without evidence of pancreatic mass or pancreatic ductal dilatation on this non pancreatic protocol CT. No discrete definable/drainable fluid collection. The adjacent vasculature appears patent. Spleen: Normal appearance of the spleen Adrenals/Urinary Tract: There is homogeneous enhancement and excretion of the bilateral kidneys. No definite renal stones on this postcontrast examination. No discrete renal lesions. No urine obstruction or perinephric stranding. Normal appearance of the bilateral adrenal glands. Normal appearance of the urinary bladder given underdistention. Stomach/Bowel: Bowel is normal in course and caliber without wall thickening or evidence of enteric obstruction. Normal appearance of the terminal ileum and appendix. Small hiatal hernia. No pneumoperitoneum, pneumatosis or portal venous gas. Vascular/Lymphatic: Scattered minimal amount of atherosclerotic plaque with a normal caliber abdominal aorta. The major branch vessels appear patent on this non CTA examination. Scattered retroperitoneal, porta hepatis and mesenteric lymph nodes are numerous though individually not enlarged by size criteria, presumably reactive in etiology. No retroperitoneal, mesenteric, pelvic or inguinal lymphadenopathy. Reproductive: Dystrophic calcifications within the prostate gland. No free fluid in the pelvic cul-de-sac. Other: Small left-sided mesenteric fat containing direct inguinal hernia. Small mesenteric fat containing periumbilical hernia. Musculoskeletal:  No acute or aggressive osseous abnormalities. Stigmata of DISH within the thoracic spine. A presumed [place] is seen within the intratrochanteric aspect of the left femur, unchanged compared to the 20/10 examination. IMPRESSION: 1. Findings suggestive of acute uncomplicated  pancreatitis. No evidence of pseudocyst formation or definable/drainable abscess. No radiopaque gallstones or intra/extrahepatic biliary ductal dilatation. 2. Punctate (2 mm) left lower lobe pulmonary nodule. No follow-up needed if patient is low-risk. Non-contrast chest CT can be considered in 12 months if patient is high-risk. This recommendation follows the consensus statement: Guidelines for Management of Incidental Pulmonary Nodules Detected on CT Images: From the Fleischner Society 2017; Radiology 2017; 284:228-243. Electronically Signed   By: Simonne Come M.D.   On: 05/01/2018 07:54   US Abdomen Limited Ruq  Result Date: 05/01/2018 CLINICAL DATA:  Elevated liver enzymes. EXAM: ULTRASOUND ABDOMEN LIMITED RIGHT UPPER QUADRANT COMPARISON:  Body CT 05/01/2018 FINDINGS: Gallbladder: No gallstones or wall thickening visualized. No sonographic Murphy sign noted by sonographer. Common bile duct: Diameter: Up to 6 mm Liver: No focal lesion identified. Within normal limits in parenchymal echogenicity. Portal vein is patent on color Doppler imaging with normal direction of blood flow towards the liver. IMPRESSION: No evidence of cholelithiasis or acute cholecystitis. Normal echogenicity of the liver. Mild prominence of the intra and extrahepatic biliary ductal system. Electronically Signed   By: Ted Mcalpine M.D.   On: 05/01/2018 15:02      Scheduled Meds: . enoxaparin (LOVENOX) injection  40 mg Subcutaneous Q24H  . folic acid  1 mg Oral Daily  . Influenza vac split quadrivalent PF  0.5 mL Intramuscular Tomorrow-1000  . multivitamin with minerals  1 tablet Oral Daily  . pantoprazole  40 mg Oral Q0600  . thiamine  100 mg Oral Daily   Or  . thiamine  100 mg Intravenous Daily   Continuous Infusions: . sodium chloride 125 mL/hr at 05/02/18 0547  . famotidine (PEPCID) IV 20 mg (05/02/18 1029)     LOS: 1 day    Time spent: Total of 35 minutes spent with pt, greater than 50% of which was spent  in discussion of  treatment, counseling and coordination of care  Latrelle Dodrill, MD Pager: Text Page via [email or web address]   If 7PM-7AM, please contact night-coverage [email or web address] 05/02/2018, 4:50 PM   Note - This record has been created using [employer]. Chart creation errors have been sought, but may not always have been located. Such creation errors do not reflect on the standard of medical care.

## 2018-05-03 DIAGNOSIS — K852 Alcohol induced acute pancreatitis without necrosis or infection: Principal | ICD-10-CM

## 2018-05-03 LAB — COMPREHENSIVE METABOLIC PANEL
ALK PHOS: 52 U/L (ref 38–126)
ALT: 22 U/L (ref 0–44)
AST: 29 U/L (ref 15–41)
Albumin: 2.7 g/dL — ABNORMAL LOW (ref 3.5–5.0)
Anion gap: 9 (ref 5–15)
BILIRUBIN TOTAL: 1.6 mg/dL — AB (ref 0.3–1.2)
BUN: 7 mg/dL (ref 6–20)
CALCIUM: 7.6 mg/dL — AB (ref 8.9–10.3)
CO2: 22 mmol/L (ref 22–32)
CREATININE: 1 mg/dL (ref 0.61–1.24)
Chloride: 106 mmol/L (ref 98–111)
GFR calc non Af Amer: >60 mL/min (ref >60)
GLUCOSE: 88 mg/dL (ref 70–99)
Potassium: 3.6 mmol/L (ref 3.5–5.1)
SODIUM: 137 mmol/L (ref 135–145)
TOTAL PROTEIN: 5.9 g/dL — AB (ref 6.5–8.1)

## 2018-05-03 LAB — LIPASE, BLOOD: Lipase: 102 U/L — ABNORMAL HIGH (ref 11–51)

## 2018-05-03 MED ORDER — ACETAMINOPHEN 500 MG PO TABS: 1000.0000 mg | ORAL_TABLET | Freq: Four times a day (QID) | ORAL | 0 refills | Status: AC | PRN

## 2018-05-03 NOTE — Progress Notes (Signed)
Pt leaving this afternoon with his wife. Pt has had 2 soft meals and is without N&V nor pain. Discharge instructions given/explained with pt verbalizing understanding.  Pt aware to followup with PCP of his choosing.

## 2018-05-03 NOTE — Discharge Summary (Signed)
Physician Discharge Summary  Charles Stone  WUJ:[id number]  DOB: 1965/10/29  DOA: 05/01/2018 PCP: Patient, No Pcp Per  Admit date: 05/01/2018 Discharge date: 05/03/2018  Admitted From: Home  Disposition: Home   Recommendations for Outpatient Follow-up:  1. Follow up with PCP in 1 week 2. Please obtain BMP/CBC in one week to monitor Hgb and renal function.  3. Alcohol cessation   Discharge Condition: Stable  CODE STATUS: Full Code  Diet recommendation: Heart Healthy  Brief/Interim Summary: For full details see H&P/Progress note, but in brief, Charles Stone is a 52 year old male with no significant medical history other than alcohol use presented to the emergency department complaining of abdominal pain for to 3 days prior to admission.  Upon ED evaluation lipase was found to be 1232, CT of the abdomen/pelvis show findings suggestive of acute pancreatitis.  Creatinine 1.2, potassium 3.4 and slight elevated BP.  AST 79 and ALT 45.  Patient was admitted with working diagnosis of acute pancreatitis.  Patient was treated conservatively, subsequently clinical improved, able to tolerate soft diet with no pain.  Patient was deemed stable for discharge to follow-up with PCP  Subjective: Patient seen and examined, denies abdominal pain, nausea, vomiting and diarrhea.  No acute events overnight.  Tolerating soft diet well.  Afebrile.  Discharge Diagnoses/Hospital Course:  Acute pancreatitis Felt to be alcohol related, as right upper quadrant ultrasound with no gallbladder pathology.  Patient report binge drinking last weekend and symptoms started about 48 hours after.  Patient was treated conservatively with IV fluids, pain control and n.p.o.  Lipase trended down to 102.  LFT normalized. Tolerating soft diet well. Encourage oral hydration and alcohol cessation discussed.  Continue pain control  Alcohol abuse No signs of alcohol withdrawal during hospital stay.  Alcohol cessation  discussed.  Elevated liver enzymes  Felt to be alcohol related, LFTs normalized, check liver function in 1 week.  Elevated pressure without diagnosis of hypertension Felt to be related to abdominal pain, blood pressure normalized without any interventions.  Hypokalemia Resolved  On the day of the discharge the patient's vitals were stable, and no other acute medical condition were reported by patient. the patient was felt safe to be discharge to home.   Discharge Instructions  You were cared for by a hospitalist during your hospital stay. If you have any questions about your discharge medications or the care you received while you were in the hospital after you are discharged, you can call the unit and asked to speak with the hospitalist on call if the hospitalist that took care of you is not available. Once you are discharged, your primary care physician will handle any further medical issues. Please note that NO REFILLS for any discharge medications will be authorized once you are discharged, as it is imperative that you return to your primary care physician (or establish a relationship with a primary care physician if you do not have one) for your aftercare needs so that they can reassess your need for medications and monitor your lab values.  Discharge Instructions    Call MD for:  difficulty breathing, headache or visual disturbances   Complete by:  As directed    Call MD for:  extreme fatigue   Complete by:  As directed    Call MD for:  hives   Complete by:  As directed    Call MD for:  persistant dizziness or light-headedness   Complete by:  As directed    Call MD for:  persistant nausea and vomiting   Complete by:  As directed    Call MD for:  redness, tenderness, or signs of infection (pain, swelling, redness, odor or green/yellow discharge around incision site)   Complete by:  As directed    Call MD for:  severe uncontrolled pain   Complete by:  As directed    Call MD  for:  temperature >100.4   Complete by:  As directed    Diet - low sodium heart healthy   Complete by:  As directed    Increase activity slowly   Complete by:  As directed      Allergies as of 05/03/2018   No Known Allergies     Medication List    TAKE these medications   acetaminophen 500 MG tablet Commonly known as:  TYLENOL Take 2 tablets (1,000 mg total) by mouth every 6 (six) hours as needed.   cetirizine 10 MG tablet Commonly known as:  ZYRTEC Take 10 mg by mouth daily.   PROAIR HFA 108 (90 Base) MCG/ACT inhaler Generic drug:  albuterol Inhale 1 puff into the lungs every 6 (six) hours as needed for wheezing or shortness of breath.       No Known Allergies  Consultations:  None    Procedures/Studies: Ct Abdomen Pelvis W Contrast  Result Date: 05/01/2018 CLINICAL DATA:  Abdominal pain.  Evaluate for diverticulitis. EXAM: CT ABDOMEN AND PELVIS WITH CONTRAST TECHNIQUE: Multidetector CT imaging of the abdomen and pelvis was performed using the standard protocol following bolus administration of intravenous contrast. CONTRAST:  ISOVUE-300 IOPAMIDOL (ISOVUE-300) INJECTION 61% COMPARISON:  10/01/2008 FINDINGS: Lower chest: Limited visualization of the lower thorax demonstrates a punctate (approximately 2 mm nodule within the imaged left lower lobe (image 3, series 6), potentially obscured by atelectasis on the 09/2008 examination. No pleural effusion. No focal airspace opacities. Normal heart size.  No pericardial effusion. Hepatobiliary: There is diffuse decreased attenuation of the hepatic parenchyma on this postcontrast examination with suspected mild nodularity hepatic contour. No discrete hepatic lesions. Normal appearance of the gallbladder given degree distention. No radiopaque gallstones. No intra or extrahepatic biliary ductal dilatation. No ascites. Pancreas: There is ill-defined stranding surrounding the pancreas most conspicuous about the mid body and pancreatic  tail (representative images 24 and 28, series 2). There is apparent homogeneous enhancement of the pancreatic parenchyma without evidence of pancreatic mass or pancreatic ductal dilatation on this non pancreatic protocol CT. No discrete definable/drainable fluid collection. The adjacent vasculature appears patent. Spleen: Normal appearance of the spleen Adrenals/Urinary Tract: There is homogeneous enhancement and excretion of the bilateral kidneys. No definite renal stones on this postcontrast examination. No discrete renal lesions. No urine obstruction or perinephric stranding. Normal appearance of the bilateral adrenal glands. Normal appearance of the urinary bladder given underdistention. Stomach/Bowel: Bowel is normal in course and caliber without wall thickening or evidence of enteric obstruction. Normal appearance of the terminal ileum and appendix. Small hiatal hernia. No pneumoperitoneum, pneumatosis or portal venous gas. Vascular/Lymphatic: Scattered minimal amount of atherosclerotic plaque with a normal caliber abdominal aorta. The major branch vessels appear patent on this non CTA examination. Scattered retroperitoneal, porta hepatis and mesenteric lymph nodes are numerous though individually not enlarged by size criteria, presumably reactive in etiology. No retroperitoneal, mesenteric, pelvic or inguinal lymphadenopathy. Reproductive: Dystrophic calcifications within the prostate gland. No free fluid in the pelvic cul-de-sac. Other: Small left-sided mesenteric fat containing direct inguinal hernia. Small mesenteric fat containing periumbilical hernia. Musculoskeletal: No acute or aggressive osseous  abnormalities. Stigmata of DISH within the thoracic spine. A presumed [place] is seen within the intratrochanteric aspect of the left femur, unchanged compared to the 20/10 examination. IMPRESSION: 1. Findings suggestive of acute uncomplicated pancreatitis. No evidence of pseudocyst formation or  definable/drainable abscess. No radiopaque gallstones or intra/extrahepatic biliary ductal dilatation. 2. Punctate (2 mm) left lower lobe pulmonary nodule. No follow-up needed if patient is low-risk. Non-contrast chest CT can be considered in 12 months if patient is high-risk. This recommendation follows the consensus statement: Guidelines for Management of Incidental Pulmonary Nodules Detected on CT Images: From the Fleischner Society 2017; Radiology 2017; 284:228-243. Electronically Signed   By: Simonne Come M.D.   On: 05/01/2018 07:54   US Abdomen Limited Ruq  Result Date: 05/01/2018 CLINICAL DATA:  Elevated liver enzymes. EXAM: ULTRASOUND ABDOMEN LIMITED RIGHT UPPER QUADRANT COMPARISON:  Body CT 05/01/2018 FINDINGS: Gallbladder: No gallstones or wall thickening visualized. No sonographic Murphy sign noted by sonographer. Common bile duct: Diameter: Up to 6 mm Liver: No focal lesion identified. Within normal limits in parenchymal echogenicity. Portal vein is patent on color Doppler imaging with normal direction of blood flow towards the liver. IMPRESSION: No evidence of cholelithiasis or acute cholecystitis. Normal echogenicity of the liver. Mild prominence of the intra and extrahepatic biliary ductal system. Electronically Signed   By: Ted Mcalpine M.D.   On: 05/01/2018 15:02    Discharge Exam: Vitals:   05/02/18 2110 05/03/18 0420  BP: 135/82 124/83  Pulse: 97 93  Resp: 16 17  Temp: 98.4 F (36.9 C) 99.4 F (37.4 C)  SpO2: 93% 99%   Vitals:   05/02/18 0549 05/02/18 1412 05/02/18 2110 05/03/18 0420  BP: 132/87 133/77 135/82 124/83  Pulse: 95 97 97 93  Resp: (!) 21 18 16 17   Temp: 99.4 F (37.4 C) 98.6 F (37 C) 98.4 F (36.9 C) 99.4 F (37.4 C)  TempSrc:  Oral Oral Oral  SpO2: 97% 97% 93% 99%  Weight:      Height:        General: Pt is alert, awake, not in acute distress Cardiovascular: RRR, S1/S2 +, no rubs, no gallops Respiratory: CTA bilaterally, no wheezing, no  rhonchi Abdominal: Soft, NT, ND, bowel sounds + Extremities: no edema, no cyanosis    The results of significant diagnostics from this hospitalization (including imaging, microbiology, ancillary and laboratory) are listed below for reference.     Microbiology: No results found for this or any previous visit (from the past 240 hour(s)).   Labs: BNP (last 3 results) No results for input(s): BNP in the last 8760 hours. Basic Metabolic Panel: Recent Labs  Lab 05/01/18 0706 05/02/18 0540 05/03/18 0517  NA 138 139 137  K 3.4* 4.0 3.6  CL 106 105 106  CO2 23 26 22   GLUCOSE 124* 123* 88  BUN 6 6 7   CREATININE 1.12 1.03 1.00  CALCIUM 9.4 8.0* 7.6*   Liver Function Tests: Recent Labs  Lab 05/01/18 0706 05/02/18 0540 05/03/18 0517  AST 79* 38 29  ALT 45* 29 22  ALKPHOS 62 54 52  BILITOT 0.6 1.5* 1.6*  PROT 7.3 6.2* 5.9*  ALBUMIN 4.0 3.2* 2.7*   Recent Labs  Lab 05/01/18 0706 05/02/18 0540 05/03/18 0517  LIPASE 1,232* 356* 102*   No results for input(s): AMMONIA in the last 168 hours. CBC: Recent Labs  Lab 05/01/18 0706  WBC 10.2  HGB 15.1  HCT 42.6  MCV 101.2*  PLT 232   Cardiac Enzymes: No  results for input(s): CKTOTAL, CKMB, CKMBINDEX, TROPONINI in the last 168 hours. BNP: Invalid input(s): POCBNP CBG: No results for input(s): GLUCAP in the last 168 hours. D-Dimer No results for input(s): DDIMER in the last 72 hours. Hgb A1c No results for input(s): HGBA1C in the last 72 hours. Lipid Profile No results for input(s): CHOL, HDL, LDLCALC, TRIG, CHOLHDL, LDLDIRECT in the last 72 hours. Thyroid function studies No results for input(s): TSH, T4TOTAL, T3FREE, THYROIDAB in the last 72 hours.  Invalid input(s): FREET3 Anemia work up No results for input(s): VITAMINB12, FOLATE, FERRITIN, TIBC, IRON, RETICCTPCT in the last 72 hours. Urinalysis    Component Value Date/Time   COLORURINE YELLOW 05/01/2018 0748   APPEARANCEUR HAZY (A) 05/01/2018 0748    LABSPEC >1.030 (H) 05/01/2018 0748   PHURINE 5.5 05/01/2018 0748   GLUCOSEU NEGATIVE 05/01/2018 0748   HGBUR NEGATIVE 05/01/2018 0748   BILIRUBINUR NEGATIVE 05/01/2018 0748   KETONESUR NEGATIVE 05/01/2018 0748   PROTEINUR NEGATIVE 05/01/2018 0748   UROBILINOGEN 0.2 10/01/2008 1031   NITRITE NEGATIVE 05/01/2018 0748   LEUKOCYTESUR NEGATIVE 05/01/2018 0748   Sepsis Labs Invalid input(s): PROCALCITONIN,  WBC,  LACTICIDVEN Microbiology No results found for this or any previous visit (from the past 240 hour(s)).   Time coordinating discharge: 25 minutes  SIGNED:  Latrelle Dodrill, MD  [facility] 05/03/2018, 11:16 AM  Pager please text page via  [email or web address]  Note - This record has been created using [employer]. Chart creation errors have been sought, but may not always have been located. Such creation errors do not reflect on the standard of medical care.

## 2021-04-01 ENCOUNTER — Other Ambulatory Visit: Payer: Self-pay

## 2021-04-01 ENCOUNTER — Emergency Department (HOSPITAL_BASED_OUTPATIENT_CLINIC_OR_DEPARTMENT_OTHER)
Admission: EM | Admit: 2021-04-01 | Discharge: 2021-04-01 | Disposition: A | Discharge status: Home / Self Care | Payer: BC Managed Care – PPO | Attending: Emergency Medicine | Admitting: Emergency Medicine

## 2021-04-01 ENCOUNTER — Emergency Department (HOSPITAL_BASED_OUTPATIENT_CLINIC_OR_DEPARTMENT_OTHER): Payer: BC Managed Care – PPO

## 2021-04-01 ENCOUNTER — Encounter (HOSPITAL_BASED_OUTPATIENT_CLINIC_OR_DEPARTMENT_OTHER): Payer: Self-pay | Admitting: *Deleted

## 2021-04-01 ENCOUNTER — Emergency Department (HOSPITAL_BASED_OUTPATIENT_CLINIC_OR_DEPARTMENT_OTHER): Payer: BC Managed Care – PPO | Admitting: Radiology

## 2021-04-01 DIAGNOSIS — W19XXXA Unspecified fall, initial encounter: Secondary | ICD-10-CM

## 2021-04-01 DIAGNOSIS — W11XXXA Fall on and from ladder, initial encounter: Secondary | ICD-10-CM | POA: Diagnosis not present

## 2021-04-01 DIAGNOSIS — M545 Low back pain, unspecified: Secondary | ICD-10-CM | POA: Diagnosis present

## 2021-04-01 DIAGNOSIS — M25572 Pain in left ankle and joints of left foot: Secondary | ICD-10-CM | POA: Insufficient documentation

## 2021-04-01 DIAGNOSIS — Y9272 Chicken coop as the place of occurrence of the external cause: Secondary | ICD-10-CM | POA: Insufficient documentation

## 2021-04-01 DIAGNOSIS — J45909 Unspecified asthma, uncomplicated: Secondary | ICD-10-CM | POA: Diagnosis not present

## 2021-04-01 DIAGNOSIS — Y990 Civilian activity done for income or pay: Secondary | ICD-10-CM | POA: Diagnosis not present

## 2021-04-01 HISTORY — DX: Unspecified asthma, uncomplicated: J45.909

## 2021-04-01 MED ORDER — OXYCODONE-ACETAMINOPHEN 5-325 MG PO TABS
2.0000 | ORAL_TABLET | Freq: Once | ORAL | Status: AC
  Administered 2021-04-01: 2 via ORAL
  Filled 2021-04-01: qty 2

## 2021-04-01 MED ORDER — OXYCODONE-ACETAMINOPHEN 5-325 MG PO TABS: 1.0000 | ORAL_TABLET | Freq: Four times a day (QID) | ORAL | 0 refills | Status: DC | PRN

## 2021-04-01 MED ORDER — NAPROXEN 500 MG PO TABS: 500.0000 mg | ORAL_TABLET | Freq: Two times a day (BID) | ORAL | 0 refills | Status: DC

## 2021-04-01 NOTE — ED Notes (Signed)
Pt ambulated with minimal assistance. Pt states he has crutches at home and will use them to help him walk. PA aware.

## 2021-04-01 NOTE — ED Triage Notes (Signed)
Charles Stone today approximately 5-6 feet.

## 2021-04-01 NOTE — ED Provider Notes (Signed)
[facility] EMERGENCY DEPT Provider Note   CSN: [id number] Arrival date & time: 04/01/21  1735     History Chief Complaint  Patient presents with   Charles Stone is a 55 y.o. male.  Patient presents to the emergency department for evaluation of back pain after fall.  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  He landed on his buttocks.  He complains of pain in his lower back, sacrum, left ankle.  He sat on a heating pack for about an hour prior to arrival but had difficulty ambulating to the bathroom due to the pain, prompting emergency department visit.  He took Tylenol without improvement.  He is able to ambulate, but does report pins-and-needles sensation in his bilateral ankles.  No lower extremity weakness.  Also c/o L ankle pain, no swelling.  He has urinated without difficulty.  The onset of this condition was acute. The course is constant. Aggravating factors: movement. Alleviating factors: none.        Past Medical History:  Diagnosis Date   Asthma    Chest pain     Patient Active Problem List   Diagnosis Date Noted   Acute pancreatitis 05/01/2018   Hypokalemia 05/01/2018   Alcohol use 05/01/2018   Chest pain 08/06/2013    Past Surgical History:  Procedure Laterality Date   right foot surgery  1995 & 1996   spine sx  2001 and 07/2012       Family History  Problem Relation Age of Onset   Cancer Mother    Alcohol abuse Mother    Alcohol abuse Father    Hypertension Sister    Diabetes Maternal Grandmother    Heart attack Maternal Grandfather     Social History   Tobacco Use   Smoking status: Never   Smokeless tobacco: Never  Vaping Use   Vaping Use: Never used  Substance Use Topics   Alcohol use: Yes    Alcohol/week: 6.0 standard drinks    Types: 2 Cans of beer, 4 Shots of liquor per week    Comment: occasionally   Drug use: Never     Home Medications Prior to Admission medications   Medication Sig Start Date End Date Taking? Authorizing Provider  cetirizine (ZYRTEC) 10 MG tablet Take 10 mg by mouth daily.   Yes [provider]  PROAIR HFA 108 (90 BASE) MCG/ACT inhaler Inhale 1 puff into the lungs every 6 (six) hours as needed for wheezing or shortness of breath.  07/27/13   [provider]    Allergies    Patient has no known allergies.  Review of Systems   Review of Systems  Constitutional:  Negative for fever and unexpected weight change.  HENT:  Negative for rhinorrhea and sore throat.   Eyes:  Negative for redness.  Respiratory:  Negative for cough.   Cardiovascular:  Negative for chest pain.  Gastrointestinal:  Negative for abdominal pain, constipation, diarrhea, nausea and vomiting.       Neg for fecal incontinence  Genitourinary:  Negative for difficulty urinating, dysuria, flank pain and hematuria.       Negative for urinary incontinence or retention  Musculoskeletal:  Positive for arthralgias, back pain and myalgias.  Skin:  Negative for rash.  Neurological:  Positive for numbness (ankle paresthesias). Negative for weakness and headaches.       Negative  for saddle paresthesias    Physical Exam Updated Vital Signs BP 135/80 (BP Location: Left Arm)   Pulse 86   Temp 98.3 F (36.8 C)   Resp 16   Ht 5\' 9"  (1.753 m)   Wt 88.5 kg   SpO2 100%   BMI 28.80 kg/m   Physical Exam Vitals and nursing note reviewed.  Constitutional:      Appearance: He is well-developed.  HENT:     Head: Normocephalic and atraumatic.  Eyes:     Conjunctiva/sclera: Conjunctivae normal.  Abdominal:     Palpations: Abdomen is soft.     Tenderness: There is no abdominal tenderness. There is no right CVA tenderness or left CVA tenderness.  Musculoskeletal:        General: Normal range of motion.     Cervical back: Normal and normal range of motion. No tenderness. Normal range of motion.      Thoracic back: No tenderness. Normal range of motion.     Lumbar back: Tenderness and bony tenderness present. No swelling.     Right hip: No tenderness or bony tenderness. Normal range of motion.     Left hip: No tenderness or bony tenderness. Normal range of motion.     Right ankle: No tenderness. Normal range of motion.     Left ankle: Tenderness present. Normal range of motion.     Comments: No step-off noted with palpation of spine.  Patient with focal tenderness over the mid lumbar spine and sacral area.  No deformities.  Skin:    General: Skin is warm and dry.  Neurological:     Mental Status: He is alert.     Sensory: No sensory deficit.     Motor: No abnormal muscle tone.     Deep Tendon Reflexes: Reflexes are normal and symmetric.     Comments: 5/5 strength in entire lower extremities bilaterally. No sensation deficit.   Psychiatric:        Mood and Affect: Mood normal.    ED Results / Procedures / Treatments   Labs (all labs ordered are listed, but only abnormal results are displayed) Labs Reviewed - No data to display  EKG None  Radiology DG Ankle Complete Left  Result Date: 04/01/2021 CLINICAL DATA:  Fall, ankle pain EXAM: LEFT ANKLE COMPLETE - 3+ VIEW COMPARISON:  None. FINDINGS: There is no evidence of fracture, dislocation, or joint effusion. There is no evidence of arthropathy or other focal bone abnormality. Soft tissues are unremarkable. IMPRESSION: Negative. Electronically Signed   By: 06/01/2021 M.D.   On: 04/01/2021 19:37   CT Lumbar Spine Wo Contrast  Result Date: 04/01/2021 CLINICAL DATA:  Back trauma, no prior imaging (Age >= 16y) Patient reports fall off ladder.  Lumbar pain. EXAM: CT LUMBAR SPINE WITHOUT CONTRAST TECHNIQUE: Multidetector CT imaging of the lumbar spine was performed without intravenous contrast administration. Multiplanar CT image reconstructions were also generated. COMPARISON:  None. FINDINGS: Segmentation: 5 lumbar type vertebrae.  Alignment: Normal. Vertebrae: No acute fracture. Normal vertebral body heights. Intact posterior elements. Tiny Schmorl's nodes and superior endplate of L1 and L2. Paraspinal and other soft tissues: No paraspinal hematoma. Atherosclerosis of the abdominal aorta. Disc levels: Minimal endplate spurring at multiple levels. No high-grade canal or bony neural foraminal narrowing. IMPRESSION: No acute fracture or subluxation of the lumbar spine. Aortic Atherosclerosis (ICD10-I70.0). Electronically Signed   By: 06/01/2021 M.D.   On: 04/01/2021 19:19   CT PELVIS WO CONTRAST  Result Date: 04/01/2021  CLINICAL DATA:  Pelvic trauma.  Fall from ladder.  Sacral pain. EXAM: CT PELVIS WITHOUT CONTRAST TECHNIQUE: Multidetector CT imaging of the pelvis was performed following the standard protocol without intravenous contrast. COMPARISON:  None. FINDINGS: Urinary Tract:  Unremarkable urinary bladder. Bowel: Occasional sigmoid diverticulosis. No diverticulitis or acute bowel inflammation. Vascular/Lymphatic: Aortic atherosclerosis. No retroperitoneal fluid or vascular stranding. No adenopathy. Reproductive:  Unremarkable prostate gland. Other: Minimal fat in the inguinal canals. No pelvic free fluid. Small fat containing umbilical hernia. Musculoskeletal: No evidence of pelvic fracture. The sacroiliac joints and pubic symphysis are congruent. Both femoral heads are well seated in the acetabulum. Pubic rami are intact. Scattered sclerotic foci consistent with benign bone islands. No intramuscular hematoma. IMPRESSION: No acute fracture of the pelvis or hips. Aortic Atherosclerosis (ICD10-I70.0). Electronically Signed   By: Narda Rutherford M.D.   On: 04/01/2021 19:26    Procedures Procedures   Medications Ordered in ED Medications - No data to display  ED Course  I have reviewed the triage vital signs and the nursing notes.  Pertinent labs & imaging results that were available during my care of the patient were  reviewed by me and considered in my medical decision making (see chart for details).  Patient seen and examined. Work-up initiated. Medications offered, declined.    Vital signs reviewed and are as follows: BP 135/80 (BP Location: Left Arm)   Pulse 86   Temp 98.3 F (36.8 C)   Resp 16   Ht 5\' 9"  (1.753 m)   Wt 88.5 kg   SpO2 100%   BMI 28.80 kg/m   8:37 PM imaging was negative.  Patient updated on results.  He ambulated with some assistance after pain medication.  I watched him walk, fairly well. He has crutches at home which he may use for extra support.   Plan to discharge to home with oral Percocet, naproxen.  Patient counseled on use of narcotic pain medications. Counseled not to combine these medications with others containing tylenol. Urged not to drink alcohol, drive, or perform any other activities that requires focus while taking these medications. The patient verbalizes understanding and agrees with the plan.    MDM Rules/Calculators/A&P                           Patient with low back, sacral, and pelvic pain as well as ankle pain after falling about 6 feet off of a ladder onto his lower back and buttocks.  CT imaging of the back and pelvis were negative.  X-ray of the ankle negative.  Patient ambulatory.  Patient had some paresthesias in his ankles, but no weakness.  He is ambulatory.  Low concern for central cord syndrome.  Discharged home with symptom control.     Final Clinical Impression(s) / ED Diagnoses Final diagnoses:  Acute midline low back pain without sciatica  Fall, initial encounter  Acute left ankle pain    Rx / DC Orders ED Discharge Orders     None        08-24-1972 04/01/21 2039    2040, MD 04/01/21 2226

## 2021-04-01 NOTE — Discharge Instructions (Addendum)
Please read and follow all provided instructions.  Your diagnoses today include:  1. Acute midline low back pain without sciatica   2. Fall, initial encounter   3. Acute left ankle pain     Tests performed today include: Vital signs - see below for your results today CT scan of your lower back and pelvis - did not show any broken bones X-ray of your ankle - no broken bones  Medications prescribed:  Percocet (oxycodone/acetaminophen) - narcotic pain medication  DO NOT drive or perform any activities that require you to be awake and alert because this medicine can make you drowsy. BE VERY CAREFUL not to take multiple medicines containing Tylenol (also called acetaminophen). Doing so can lead to an overdose which can damage your liver and cause liver failure and possibly death.  Naproxen - anti-inflammatory pain medication Do not exceed 500mg  naproxen every 12 hours, take with food  You have been prescribed an anti-inflammatory medication or NSAID. Take with food. Take smallest effective dose for the shortest duration needed for your pain. Stop taking if you experience stomach pain or vomiting.   Take any prescribed medications only as directed.  Home care instructions:  Follow any educational materials contained in this packet Please rest, use ice or heat on your back for the next several days Do not lift, push, pull anything more than 10 pounds for the next week  Follow-up instructions: Please follow-up with your primary care provider in the next 1 week for further evaluation of your symptoms.   Return instructions:  SEEK IMMEDIATE MEDICAL ATTENTION IF YOU HAVE: New numbness, tingling, weakness, or problem with the use of your arms or legs Severe back pain not relieved with medications Loss control of your bowels or bladder Increasing pain in any areas of the body (such as chest or abdominal pain) Shortness of breath, dizziness, or fainting.  Worsening nausea (feeling sick to  your stomach), vomiting, fever, or sweats Any other emergent concerns regarding your health   Additional Information:  Your vital signs today were: BP (!) 138/93 (BP Location: Right Arm)   Pulse 73   Temp 98.3 F (36.8 C)   Resp 20   Ht 5\' 9"  (1.753 m)   Wt 88.5 kg   SpO2 100%   BMI 28.80 kg/m  If your blood pressure (BP) was elevated above 135/85 this visit, please have this repeated by your doctor within one month. --------------

## 2021-08-18 ENCOUNTER — Other Ambulatory Visit: Payer: Self-pay

## 2021-08-18 ENCOUNTER — Encounter (HOSPITAL_BASED_OUTPATIENT_CLINIC_OR_DEPARTMENT_OTHER): Payer: Self-pay | Admitting: Emergency Medicine

## 2021-08-18 DIAGNOSIS — Z23 Encounter for immunization: Secondary | ICD-10-CM | POA: Diagnosis not present

## 2021-08-18 DIAGNOSIS — W268XXA Contact with other sharp object(s), not elsewhere classified, initial encounter: Secondary | ICD-10-CM | POA: Diagnosis not present

## 2021-08-18 DIAGNOSIS — S01311A Laceration without foreign body of right ear, initial encounter: Secondary | ICD-10-CM | POA: Insufficient documentation

## 2021-08-18 DIAGNOSIS — S0993XA Unspecified injury of face, initial encounter: Secondary | ICD-10-CM | POA: Diagnosis present

## 2021-08-18 NOTE — ED Triage Notes (Signed)
Presents for R ear laceration, had been drinking to celebrate his birthday and fell tonight. Bleeding controlled in triage. Denies blood thinners. Denies HA, vision change, numbness or tingling, neck pain, back pain. Voice is clear.

## 2021-08-19 ENCOUNTER — Emergency Department (HOSPITAL_BASED_OUTPATIENT_CLINIC_OR_DEPARTMENT_OTHER)
Admission: EM | Admit: 2021-08-19 | Discharge: 2021-08-19 | Disposition: A | Discharge status: Home / Self Care | Payer: BC Managed Care – PPO | Attending: Emergency Medicine | Admitting: Emergency Medicine

## 2021-08-19 DIAGNOSIS — S01311A Laceration without foreign body of right ear, initial encounter: Secondary | ICD-10-CM

## 2021-08-19 MED ORDER — TETANUS-DIPHTH-ACELL PERTUSSIS 5-2.5-18.5 LF-MCG/0.5 IM SUSY
0.5000 mL | PREFILLED_SYRINGE | Freq: Once | INTRAMUSCULAR | Status: AC
  Administered 2021-08-19: 0.5 mL via INTRAMUSCULAR
  Filled 2021-08-19: qty 0.5

## 2021-08-19 MED ORDER — LIDOCAINE HCL (PF) 1 % IJ SOLN: 5.0000 mL | Freq: Once | INTRAMUSCULAR | Status: DC

## 2021-08-19 MED ORDER — LIDOCAINE HCL (PF) 1 % IJ SOLN
10.0000 mL | Freq: Once | INTRAMUSCULAR | Status: AC
  Administered 2021-08-19: 10 mL
  Filled 2021-08-19: qty 10

## 2021-08-19 MED ORDER — OXYCODONE-ACETAMINOPHEN 5-325 MG PO TABS
1.0000 | ORAL_TABLET | Freq: Once | ORAL | Status: AC
  Administered 2021-08-19: 1 via ORAL
  Filled 2021-08-19: qty 1

## 2021-08-19 NOTE — Discharge Instructions (Signed)
Please follow-up with the ear nose and throat doctor.  Keep your ear clean and dry.  Come back to ER if you develop significant skin discoloration, drainage, redness, fever or other new concerning symptom.  Take Tylenol and Motrin as needed for pain control.

## 2021-08-20 NOTE — ED Provider Notes (Signed)
[facility] EMERGENCY DEPT Provider Note   CSN: [id number] Arrival date & time: 08/18/21  2312     History  Chief Complaint  Patient presents with   Ear Laceration    Charles Stone is a 56 y.o. male.  Presenting to the emergency room with concern for ear laceration.  Patient states that he fell and struck his ear on a cedar chest and suffered a ear laceration.  States that his ear has had a laceration previously.  Has having some pain in the ear but relatively mild.  Worse when ear is being touched.  Denies passing out.  No headache nausea or vomiting.  Not on blood thinners.  Denies any major medical problems.  Unsure of last tetanus.  HPI     Home Medications Prior to Admission medications   Medication Sig Start Date End Date Taking? Authorizing Provider  cetirizine (ZYRTEC) 10 MG tablet Take 10 mg by mouth daily.    [provider]  naproxen (NAPROSYN) 500 MG tablet Take 1 tablet (500 mg total) by mouth 2 (two) times daily. 04/01/21   Carlisle Cater, PA-C  oxyCODONE-acetaminophen (PERCOCET/ROXICET) 5-325 MG tablet Take 1 tablet by mouth every 6 (six) hours as needed for severe pain. 04/01/21   Carlisle Cater, PA-C  PROAIR HFA 108 (90 BASE) MCG/ACT inhaler Inhale 1 puff into the lungs every 6 (six) hours as needed for wheezing or shortness of breath.  07/27/13   [provider]      Allergies    Patient has no known allergies.    Review of Systems   Review of Systems  HENT:  Positive for ear pain.   All other systems reviewed and are negative.  Physical Exam Updated Vital Signs BP 124/78    Pulse 73    Temp 98.3 F (36.8 C) (Oral)    Resp 16    Wt 97.5 kg    SpO2 98%    BMI 31.75 kg/m  Physical Exam Vitals and nursing note reviewed.  Constitutional:      General: He is not in acute distress.    Appearance: He is well-developed.  HENT:     Head: Normocephalic.     Ears:     Comments: Right ear: There is approximately 2 cm long laceration  to the upper earlobe extending from helix and antihelix, no active bleeding noted, cap refill to the superior ear is normal Eyes:     Conjunctiva/sclera: Conjunctivae normal.  Cardiovascular:     Rate and Rhythm: Normal rate.     Pulses: Normal pulses.  Pulmonary:     Effort: Pulmonary effort is normal. No respiratory distress.  Musculoskeletal:        General: No swelling.     Cervical back: Neck supple.  Skin:    General: Skin is warm and dry.     Capillary Refill: Capillary refill takes less than 2 seconds.  Neurological:     General: No focal deficit present.     Mental Status: He is alert and oriented to person, place, and time.  Psychiatric:        Mood and Affect: Mood normal.    Media Information Document Information  Photos  Right ear  08/19/2021 01:58  Attached To:  Hospital Encounter on 08/19/21   Source Information  Jariyah Hackley, Ellwood Dense, MD   [facility]    ED Results / Procedures / Treatments   Labs (all labs ordered are listed, but only abnormal results are displayed) Labs  Reviewed - No data to display  EKG None  Radiology No results found.  Procedures .Marland KitchenLaceration Repair  Date/Time: 08/20/2021 2:05 AM Performed by: Lucrezia Starch, MD Authorized by: Lucrezia Starch, MD   Consent:    Consent obtained:  Verbal   Consent given by:  Patient   Risks, benefits, and alternatives were discussed: yes     Risks discussed:  Infection, need for additional repair, nerve damage, poor wound healing, pain, poor cosmetic result, retained foreign body, tendon damage and vascular damage   Alternatives discussed:  No treatment, delayed treatment, observation and referral Universal protocol:    Immediately prior to procedure, a time out was called: yes     Patient identity confirmed:  Verbally with patient Anesthesia:    Anesthesia method:  Local infiltration   Local anesthetic:  Lidocaine 1% w/o epi Laceration details:    Location:  Ear   Ear  location:  R ear   Length (cm):  2 Exploration:    Wound exploration: wound explored through full range of motion     Contaminated: no   Treatment:    Area cleansed with:  Povidone-iodine and saline   Amount of cleaning:  Extensive   Irrigation solution:  Sterile saline   Irrigation method:  Syringe   Visualized foreign bodies/material removed: no     Debridement:  None   Undermining:  None   Scar revision: no     Layers/structures repaired:  Deep subcutaneous (cartilage) Deep subcutaneous:    Suture size:  5-0   Suture material:  Vicryl   Number of sutures:  1 Skin repair:    Repair method:  Sutures   Suture size:  5-0   Suture material:  Fast-absorbing gut   Suture technique:  Simple interrupted   Number of sutures:  7 Approximation:    Approximation:  Close Repair type:    Repair type:  Complex Post-procedure details:    Dressing:  Open (no dressing)   Procedure completion:  Tolerated well, no immediate complications Comments:     Wound was thoroughly irrigated with sterile saline solution as well as with dilute Betadine solution.  Local anesthesia with 1% lidocaine without epinephrine.  Approximated cartilage using 1 deep 5-0 Vicryl suture.  Then closed the wound on the anterior surface using 4 simple interrupted sutures, then closed the wound posteriorly using 3 simple interrupted sutures.  Good wound closure was achieved, there is now no exposed cartilage.    Medications Ordered in ED Medications  Tdap (BOOSTRIX) injection 0.5 mL (0.5 mLs Intramuscular Given 08/19/21 0231)  lidocaine (PF) (XYLOCAINE) 1 % injection 10 mL (10 mLs Infiltration Given 08/19/21 0234)  oxyCODONE-acetaminophen (PERCOCET/ROXICET) 5-325 MG per tablet 1 tablet (1 tablet Oral Given 08/19/21 0230)    ED Course/ Medical Decision Making/ A&P                           Medical Decision Making Risk Prescription drug management.   56 year old gentleman presented to the emergency room with concern  for ear laceration after mechanical fall.  On physical exam isolated trauma to right ear.  Laceration extending from helix into the antihelix, cartilaginous involvement.  Discussed case with Dr. Conley Simmonds on-call for max face trauma.  He reviewed imaging and case.  Feels strongly patient would be appropriate for ER closure; recommended placing at least 1 Vicryl deep suture to approximate the cartilage followed by superficial closure of the skin with 5-0 fast gut.  Updated patient's tetanus.  Completed very thorough irrigation and cleansing of wound.  No gross contamination appreciated.  Per directions from specialist, approximated the cartilage using deep suture then closed the skin with superficial sutures on the anterior surface of the ear followed by superficial sutures on the posterior surface of the ear.  Good wound approximation was achieved, there is now no exposed cartilage.  Good hemostasis.  Dr. Conley Simmonds recommended that patient follow-up with ENT and not in his office.  No specific requests/recommendations from [facility] regarding need for prophylactic antibiotics. Per review of [employer] on this subject, insufficient evidence for routine use of prophylactic antibiotics even in cases of cartilaginous involvement, and they specifically recommend against use unless gross contamination or bite wound.    Patient was provided with on-call for ENT information.  Discussed potential complications of ear lacerations and counseled on return precautions as well as stressed need for follow-up with specialist to monitor wound healing.  Patient's significant other at bedside provided some additional history.  She was updated throughout the case.         Final Clinical Impression(s) / ED Diagnoses Final diagnoses:  Laceration of right ear lobe, initial encounter    Rx / DC Orders ED Discharge Orders     None         Lucrezia Starch, MD 08/20/21 [id number]

## 2021-10-13 IMAGING — CT CT L SPINE W/O CM
3 series · 12 of 33 positions shown, 14 images · non-contrast
Comparison: None.

CLINICAL DATA: Back trauma, no prior imaging (Age >= 16y)

Patient reports fall off ladder.  Lumbar pain.
EXAM:
CT LUMBAR SPINE WITHOUT CONTRAST
TECHNIQUE: Multidetector CT imaging of the lumbar spine was performed without
intravenous contrast administration. Multiplanar CT image
reconstructions were also generated.

[Series 6: l-spine wo soft tissue · axial · 0.36mm/px · z∈[+994,+1182]mm · 4 of 136 slices shown, 5 images]
[im 21/136  soft-tissue]
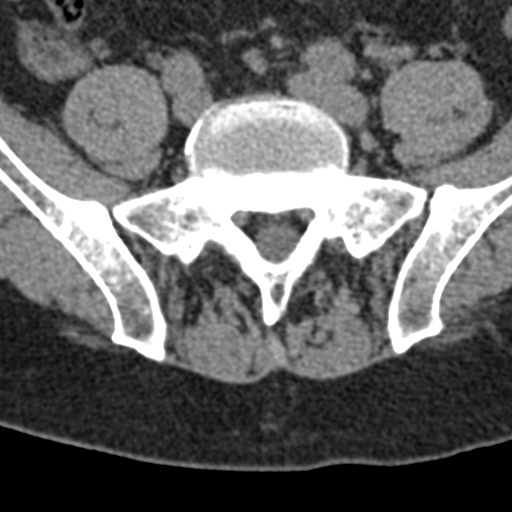
[im 21/136  bone]
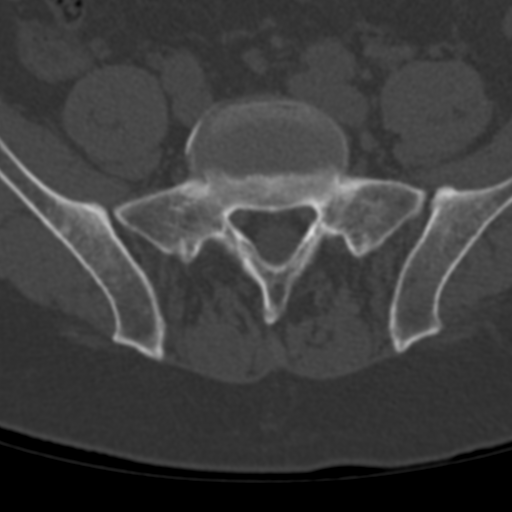
[im 52/136  bone]
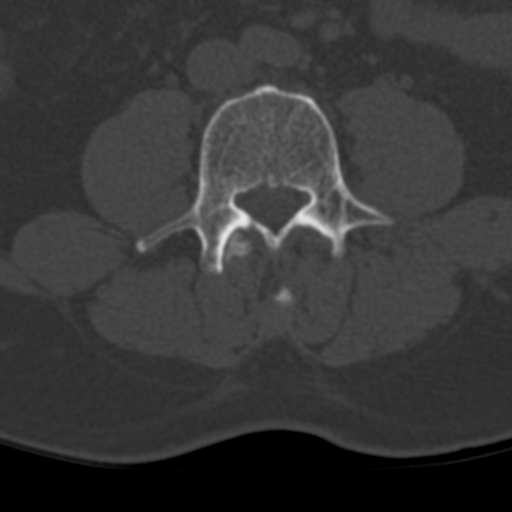
[im 84/136  bone]
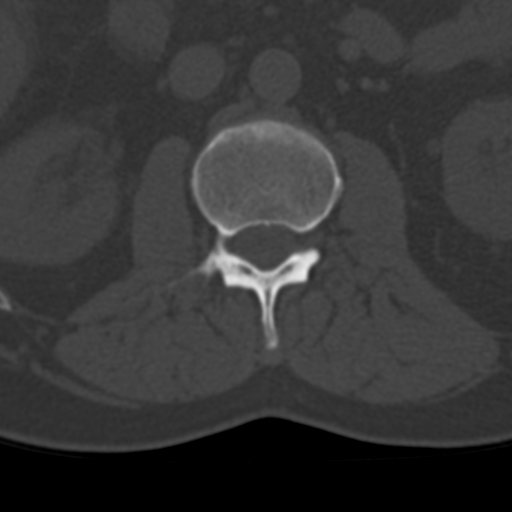
[im 115/136  bone]
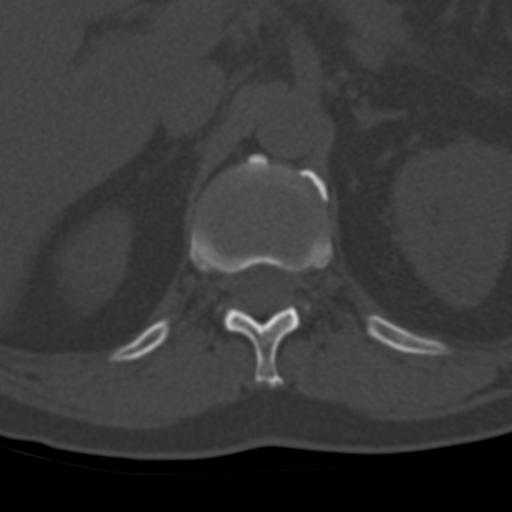

[Series 7: coronal bone · coronal · 0.32mm/px · 3 of 75 slices shown]
[im 15/75  bone]
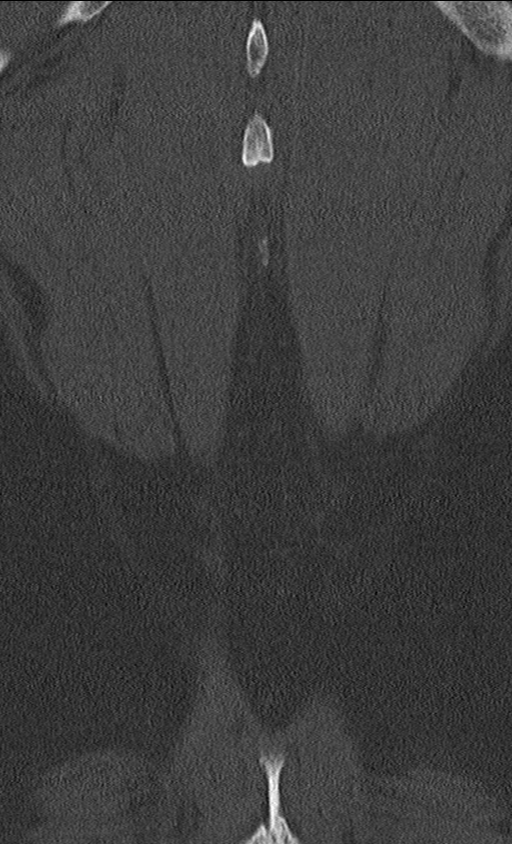
[im 30/75  bone]
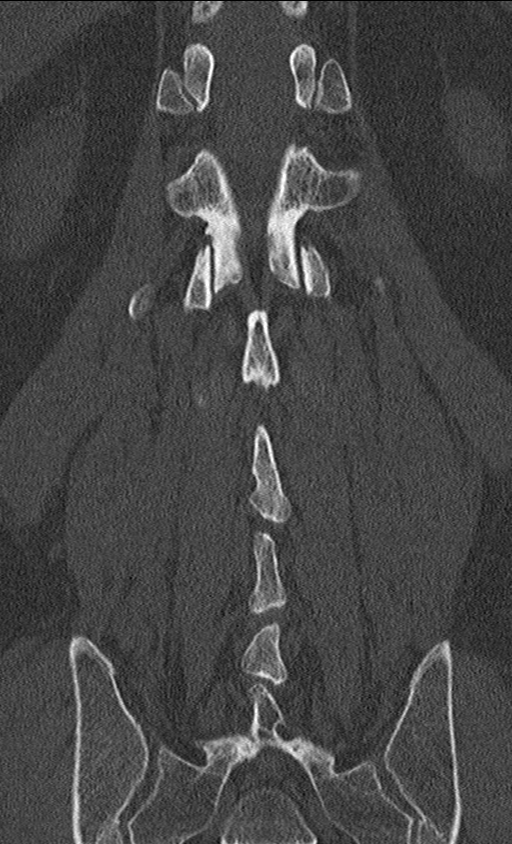
[im 45/75  bone]
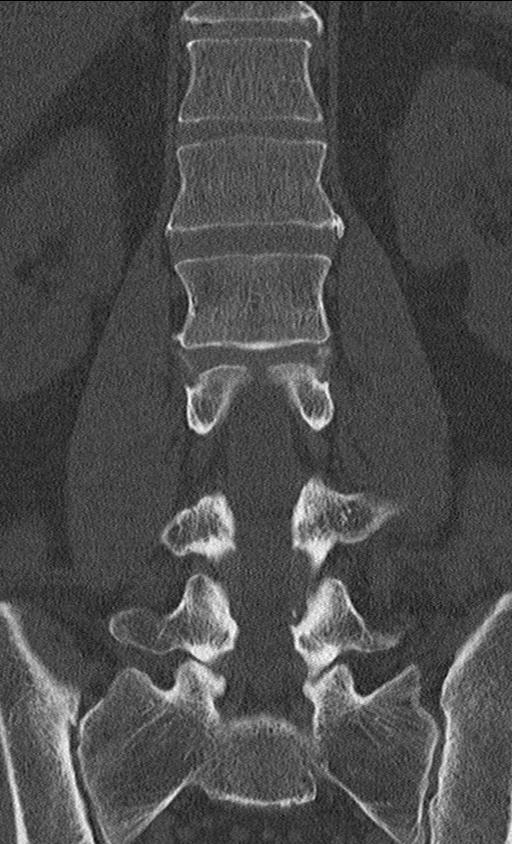

[Series 10: sagittal soft tissue · sagittal · 0.35mm/px · 5 of 68 slices shown, 6 images]
[im 23/68  bone]
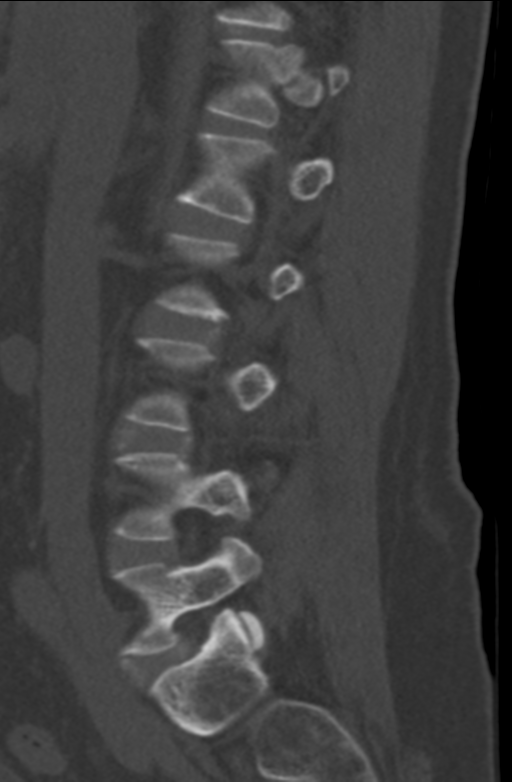
[im 28/68  bone]
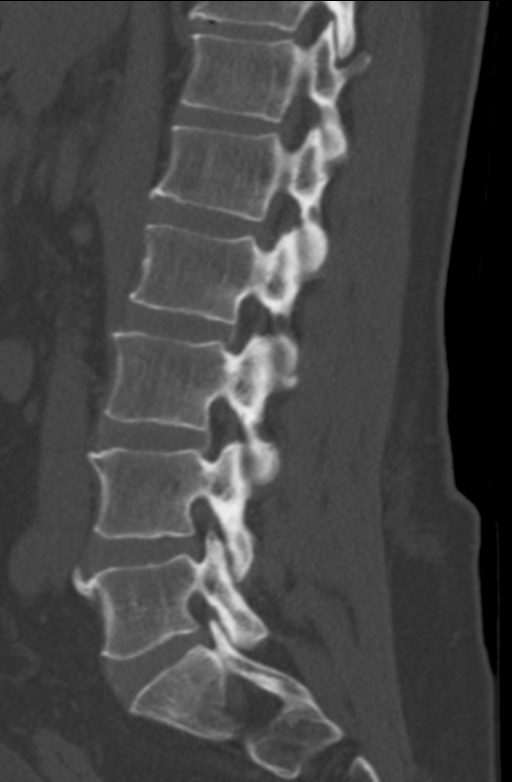
[im 34/68  soft-tissue]
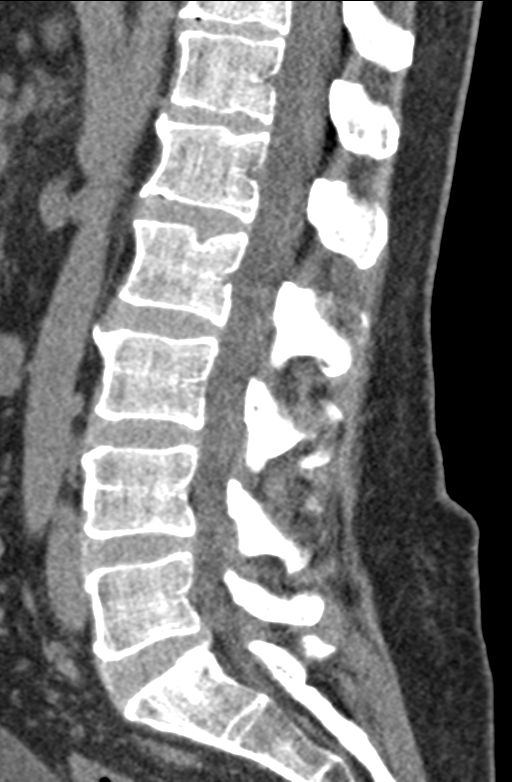
[im 34/68  bone]
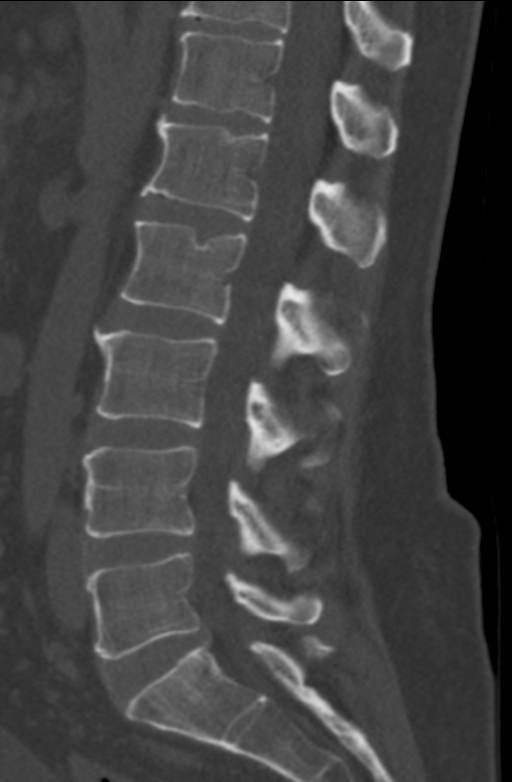
[im 40/68  bone]
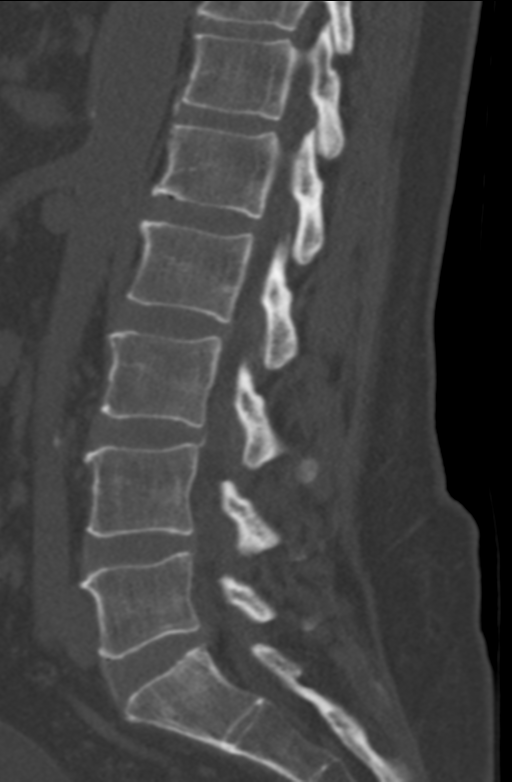
[im 45/68  bone]
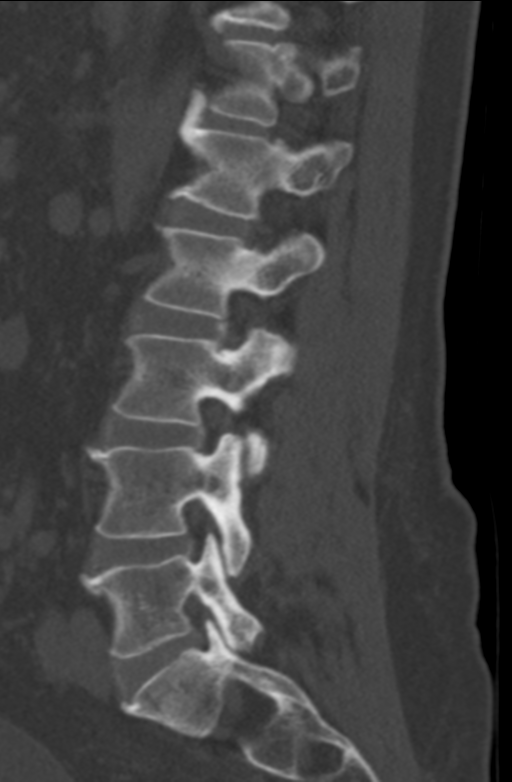

[12 of 33 positions shown; findings below may reference images not displayed]

FINDINGS: Segmentation: 5 lumbar type vertebrae.

Alignment: Normal.

Vertebrae: No acute fracture. Normal vertebral body heights. Intact
posterior elements. Tiny Schmorl's nodes and superior endplate of L1
and L2.

Paraspinal and other soft tissues: No paraspinal hematoma.
Atherosclerosis of the abdominal aorta.

Disc levels: Minimal endplate spurring at multiple levels. No
high-grade canal or bony neural foraminal narrowing.
IMPRESSION: No acute fracture or subluxation of the lumbar spine.

Aortic Atherosclerosis ([id number]).

## 2021-10-13 IMAGING — DX DG ANKLE COMPLETE 3+V*L*
1 series · 3 of 3 positions shown · non-contrast
Comparison: None.

CLINICAL DATA: Fall, ankle pain

EXAM:
LEFT ANKLE COMPLETE - 3+ VIEW

[Series 1: ankle · 0.14mm/px · 3 of 3 slices shown]
[im 1/3]
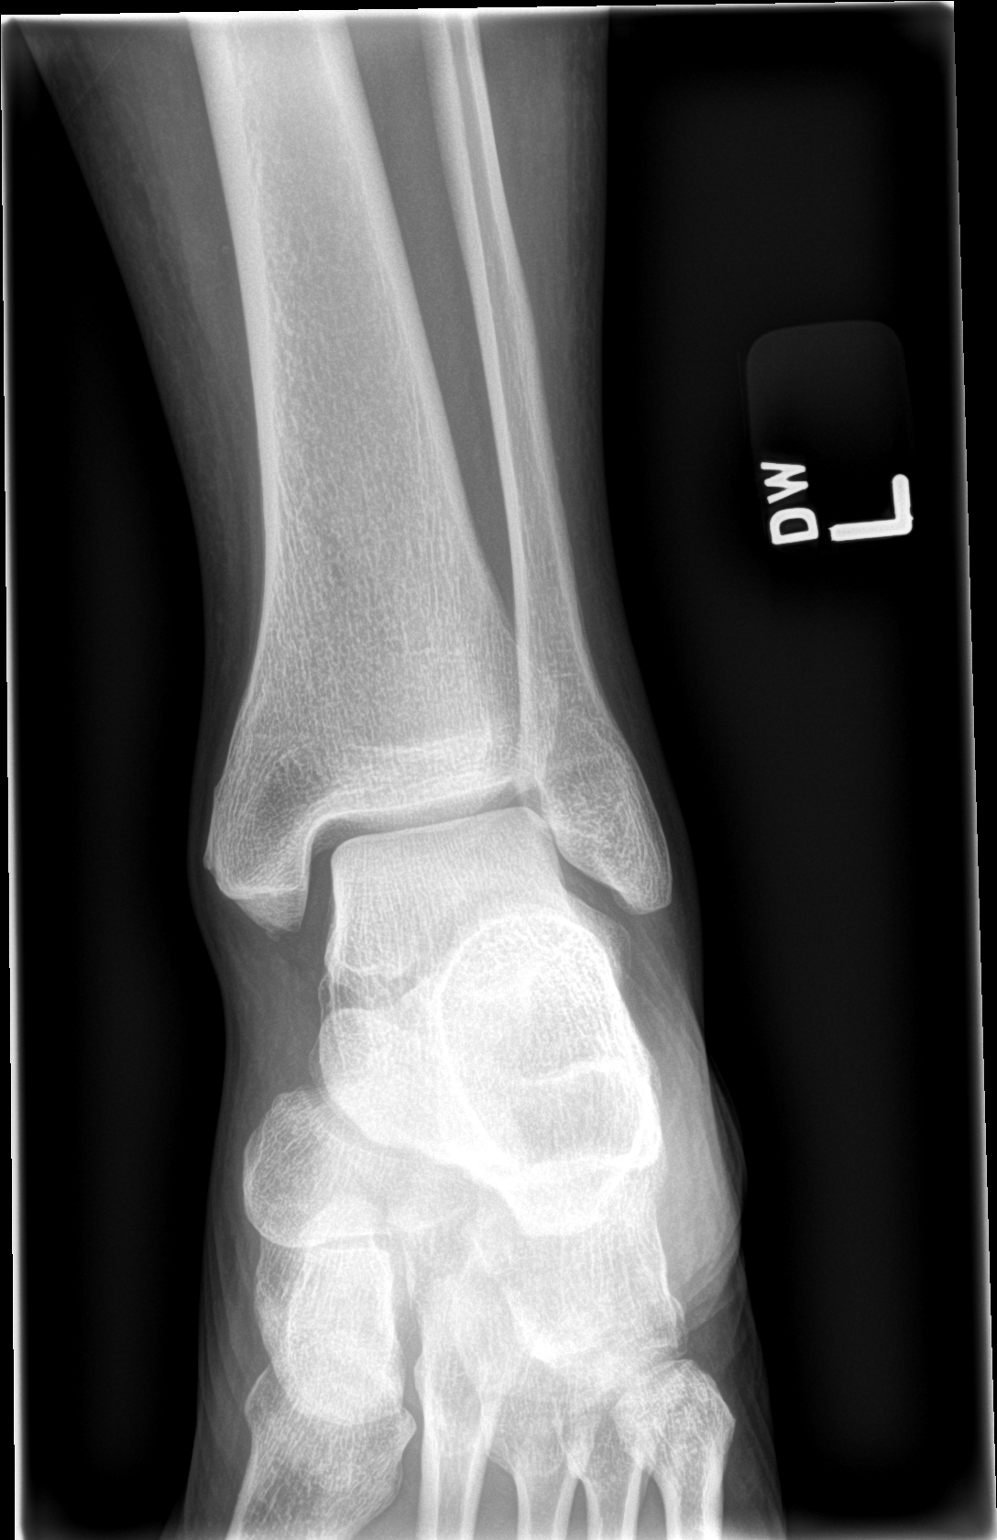
[im 2/3]
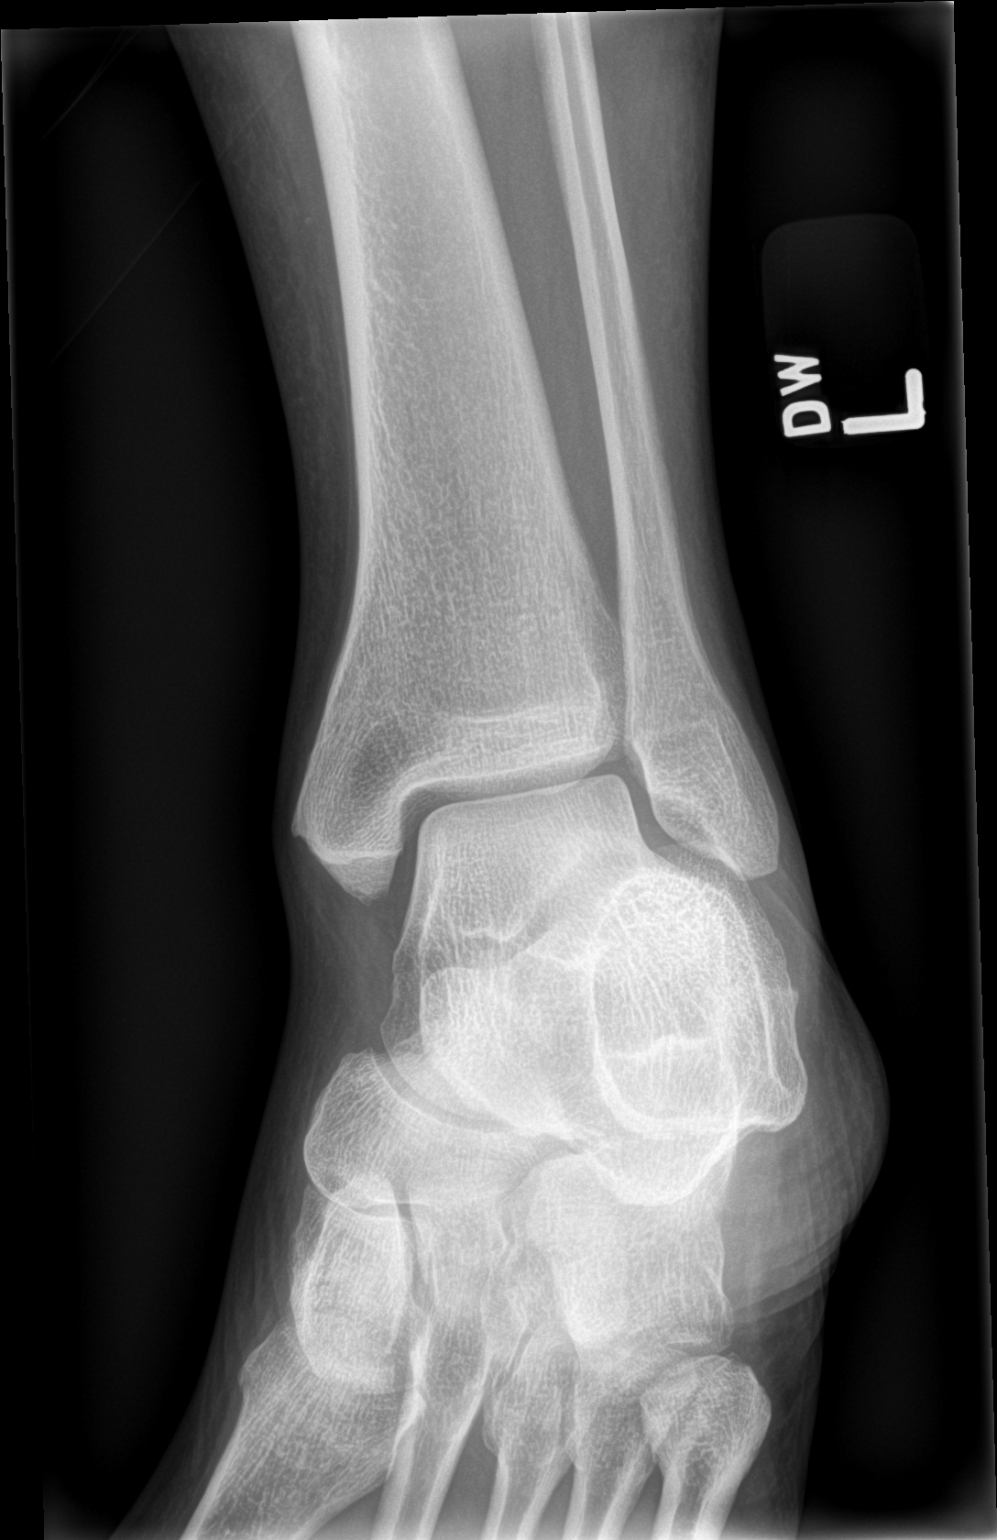
[im 3/3]
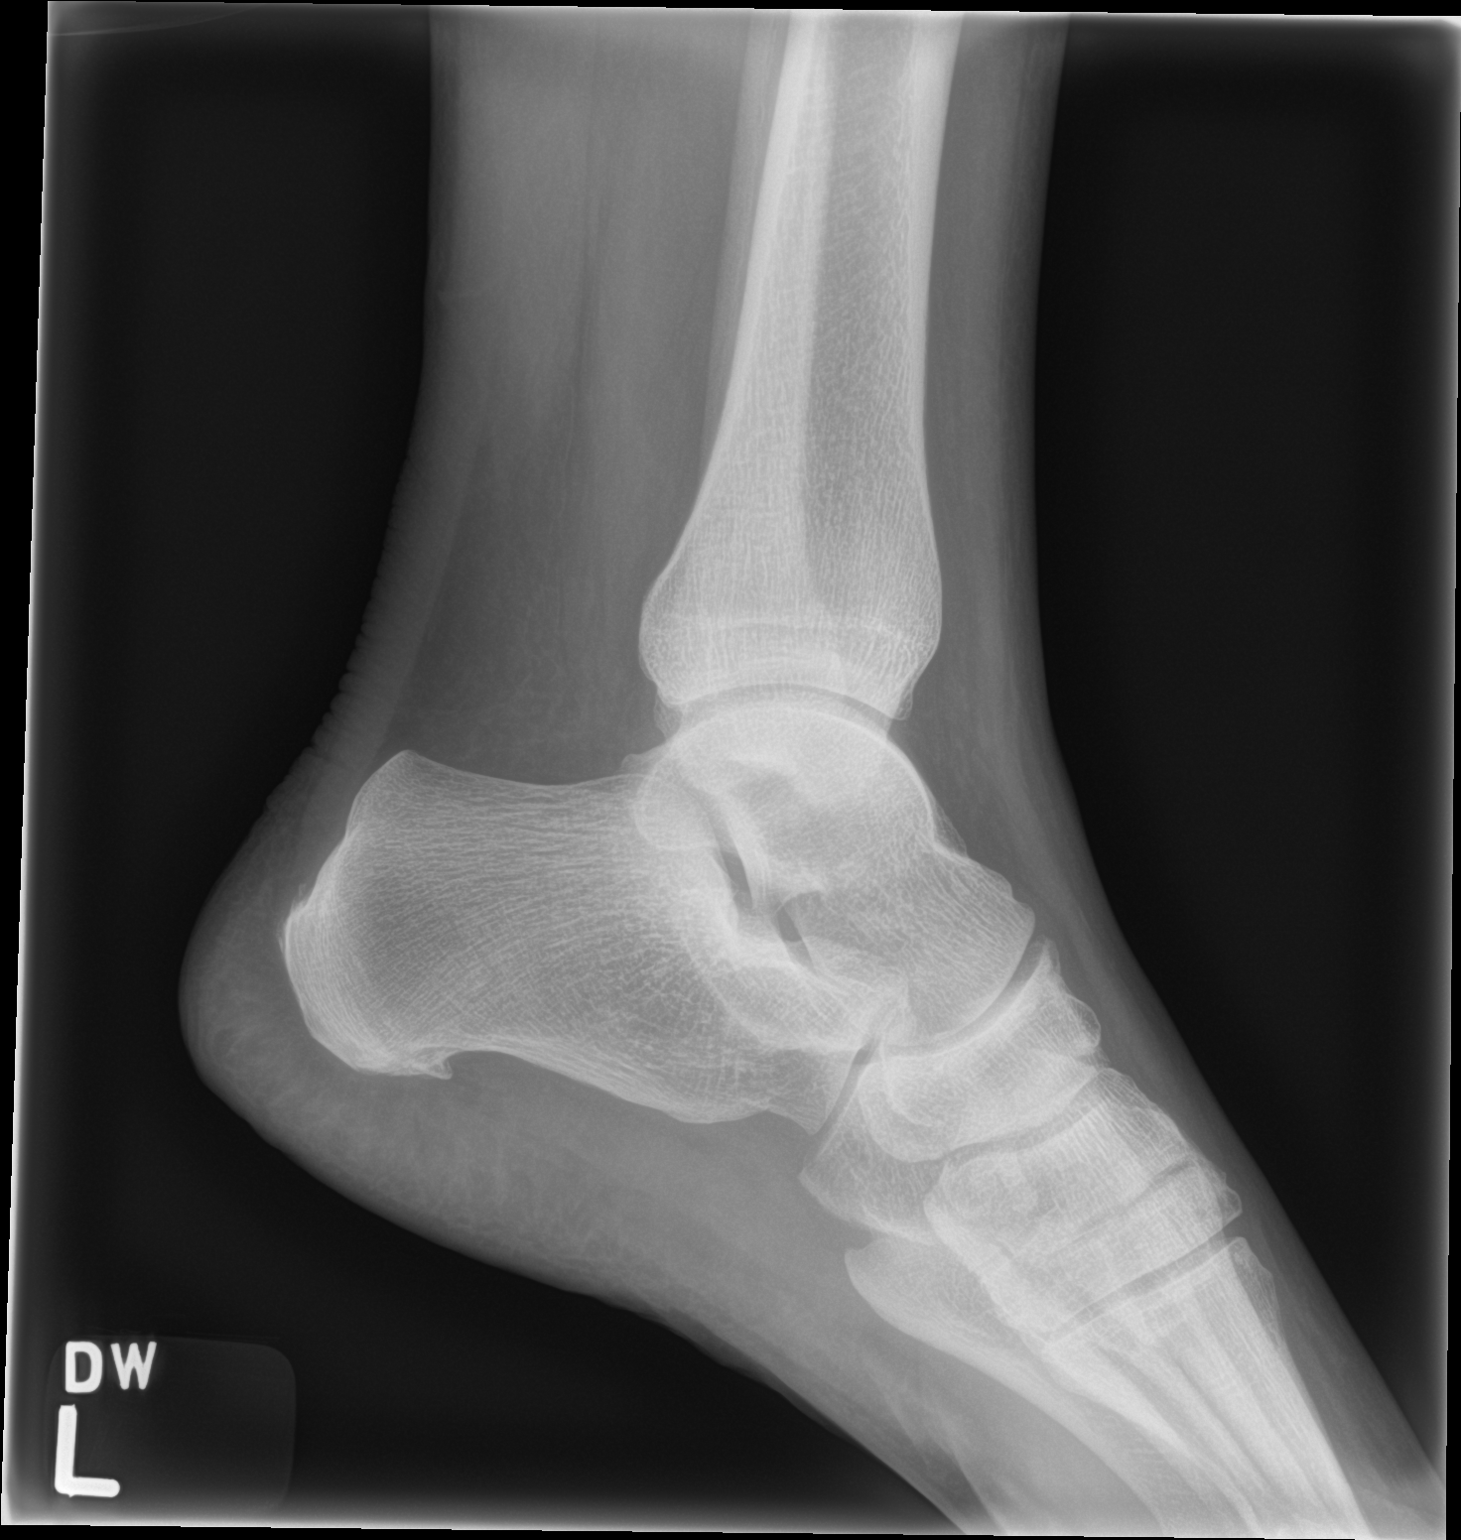

[3 of 3 positions shown; findings below may reference images not displayed]

FINDINGS: There is no evidence of fracture, dislocation, or joint effusion.
There is no evidence of arthropathy or other focal bone abnormality.
Soft tissues are unremarkable.
IMPRESSION: Negative.

## 2022-05-22 ENCOUNTER — Emergency Department (HOSPITAL_BASED_OUTPATIENT_CLINIC_OR_DEPARTMENT_OTHER): Payer: BC Managed Care – PPO | Admitting: Radiology

## 2022-05-22 ENCOUNTER — Encounter (HOSPITAL_BASED_OUTPATIENT_CLINIC_OR_DEPARTMENT_OTHER): Payer: Self-pay

## 2022-05-22 ENCOUNTER — Other Ambulatory Visit: Payer: Self-pay

## 2022-05-22 DIAGNOSIS — Y929 Unspecified place or not applicable: Secondary | ICD-10-CM | POA: Insufficient documentation

## 2022-05-22 DIAGNOSIS — T63301A Toxic effect of unspecified spider venom, accidental (unintentional), initial encounter: Secondary | ICD-10-CM | POA: Insufficient documentation

## 2022-05-22 DIAGNOSIS — R030 Elevated blood-pressure reading, without diagnosis of hypertension: Secondary | ICD-10-CM | POA: Insufficient documentation

## 2022-05-22 DIAGNOSIS — R7989 Other specified abnormal findings of blood chemistry: Secondary | ICD-10-CM | POA: Insufficient documentation

## 2022-05-22 DIAGNOSIS — R770 Abnormality of albumin: Secondary | ICD-10-CM | POA: Insufficient documentation

## 2022-05-22 DIAGNOSIS — Z5321 Procedure and treatment not carried out due to patient leaving prior to being seen by health care provider: Secondary | ICD-10-CM | POA: Diagnosis not present

## 2022-05-22 DIAGNOSIS — D649 Anemia, unspecified: Secondary | ICD-10-CM | POA: Insufficient documentation

## 2022-05-22 DIAGNOSIS — K709 Alcoholic liver disease, unspecified: Secondary | ICD-10-CM | POA: Insufficient documentation

## 2022-05-22 DIAGNOSIS — L03113 Cellulitis of right upper limb: Secondary | ICD-10-CM | POA: Insufficient documentation

## 2022-05-22 DIAGNOSIS — D696 Thrombocytopenia, unspecified: Secondary | ICD-10-CM | POA: Diagnosis not present

## 2022-05-22 DIAGNOSIS — E8809 Other disorders of plasma-protein metabolism, not elsewhere classified: Secondary | ICD-10-CM | POA: Insufficient documentation

## 2022-05-22 NOTE — ED Triage Notes (Signed)
Pt reports R hand swelling and bruising since yesterday. Pt wife states that she thinks that he was bitten by a spider. Denies injury. Does not recall being bitten by anything. Bruising spreading into wrist.

## 2022-05-23 ENCOUNTER — Encounter (HOSPITAL_BASED_OUTPATIENT_CLINIC_OR_DEPARTMENT_OTHER): Payer: Self-pay | Admitting: Emergency Medicine

## 2022-05-23 ENCOUNTER — Other Ambulatory Visit: Payer: Self-pay

## 2022-05-23 ENCOUNTER — Emergency Department (HOSPITAL_BASED_OUTPATIENT_CLINIC_OR_DEPARTMENT_OTHER)
Admission: EM | Admit: 2022-05-23 | Discharge: 2022-05-23 | Disposition: A | Discharge status: Home / Self Care | Payer: BC Managed Care – PPO | Source: Home / Self Care | Attending: Emergency Medicine | Admitting: Emergency Medicine

## 2022-05-23 ENCOUNTER — Other Ambulatory Visit (HOSPITAL_BASED_OUTPATIENT_CLINIC_OR_DEPARTMENT_OTHER): Payer: Self-pay

## 2022-05-23 ENCOUNTER — Emergency Department (HOSPITAL_BASED_OUTPATIENT_CLINIC_OR_DEPARTMENT_OTHER)
Admission: EM | Admit: 2022-05-23 | Discharge: 2022-05-23 | Discharge status: Against Medical Advice | Payer: BC Managed Care – PPO | Attending: Emergency Medicine | Admitting: Emergency Medicine

## 2022-05-23 DIAGNOSIS — R030 Elevated blood-pressure reading, without diagnosis of hypertension: Secondary | ICD-10-CM | POA: Insufficient documentation

## 2022-05-23 DIAGNOSIS — K709 Alcoholic liver disease, unspecified: Secondary | ICD-10-CM | POA: Insufficient documentation

## 2022-05-23 DIAGNOSIS — T148XXA Other injury of unspecified body region, initial encounter: Secondary | ICD-10-CM

## 2022-05-23 DIAGNOSIS — R770 Abnormality of albumin: Secondary | ICD-10-CM | POA: Insufficient documentation

## 2022-05-23 DIAGNOSIS — R7989 Other specified abnormal findings of blood chemistry: Secondary | ICD-10-CM

## 2022-05-23 DIAGNOSIS — D696 Thrombocytopenia, unspecified: Secondary | ICD-10-CM | POA: Insufficient documentation

## 2022-05-23 DIAGNOSIS — L03113 Cellulitis of right upper limb: Secondary | ICD-10-CM

## 2022-05-23 DIAGNOSIS — D649 Anemia, unspecified: Secondary | ICD-10-CM | POA: Insufficient documentation

## 2022-05-23 DIAGNOSIS — M7989 Other specified soft tissue disorders: Secondary | ICD-10-CM

## 2022-05-23 DIAGNOSIS — R945 Abnormal results of liver function studies: Secondary | ICD-10-CM | POA: Insufficient documentation

## 2022-05-23 DIAGNOSIS — E8809 Other disorders of plasma-protein metabolism, not elsewhere classified: Secondary | ICD-10-CM

## 2022-05-23 LAB — CBC
HCT: 26.2 % — ABNORMAL LOW (ref 39.0–52.0)
Hemoglobin: 9.3 g/dL — ABNORMAL LOW (ref 13.0–17.0)
MCH: 38.9 pg — ABNORMAL HIGH (ref 26.0–34.0)
MCHC: 35.5 g/dL (ref 30.0–36.0)
MCV: 109.6 fL — ABNORMAL HIGH (ref 80.0–100.0)
Platelets: 98 K/uL — ABNORMAL LOW (ref 150–400)
RBC: 2.39 MIL/uL — ABNORMAL LOW (ref 4.22–5.81)
RDW: 16.4 % — ABNORMAL HIGH (ref 11.5–15.5)
WBC: 5.4 K/uL (ref 4.0–10.5)
nRBC: 0 % (ref 0.0–0.2)

## 2022-05-23 LAB — COMPREHENSIVE METABOLIC PANEL
ALT: 19 U/L (ref 0–44)
AST: 66 U/L — ABNORMAL HIGH (ref 15–41)
Albumin: 2.6 g/dL — ABNORMAL LOW (ref 3.5–5.0)
Alkaline Phosphatase: 154 U/L — ABNORMAL HIGH (ref 38–126)
Anion gap: 11 (ref 5–15)
BUN: 15 mg/dL (ref 6–20)
CO2: 25 mmol/L (ref 22–32)
Calcium: 9.5 mg/dL (ref 8.9–10.3)
Chloride: 96 mmol/L — ABNORMAL LOW (ref 98–111)
Creatinine, Ser: 1.12 mg/dL (ref 0.61–1.24)
GFR, Estimated: >60 mL/min (ref >60)
Glucose, Bld: 137 mg/dL — ABNORMAL HIGH (ref 70–99)
Potassium: 3.6 mmol/L (ref 3.5–5.1)
Sodium: 132 mmol/L — ABNORMAL LOW (ref 135–145)
Total Bilirubin: 8 mg/dL — ABNORMAL HIGH (ref 0.3–1.2)
Total Protein: 7.2 g/dL (ref 6.5–8.1)

## 2022-05-23 LAB — FERRITIN: Ferritin: 1007 ng/mL — ABNORMAL HIGH (ref 24–336)

## 2022-05-23 LAB — FOLATE: Folate: 7 ng/mL (ref >5.9)

## 2022-05-23 LAB — RETICULOCYTES
Immature Retic Fract: 13.2 % (ref 2.3–15.9)
RBC.: 2.36 MIL/uL — ABNORMAL LOW (ref 4.22–5.81)
Retic Count, Absolute: 72.2 10*3/uL (ref 19.0–186.0)
Retic Ct Pct: 3.1 % (ref 0.4–3.1)

## 2022-05-23 MED ORDER — CEPHALEXIN 500 MG PO CAPS
500.0000 mg | ORAL_CAPSULE | Freq: Four times a day (QID) | ORAL | 0 refills | Status: DC
  Filled 2022-05-23: qty 28, 7d supply, fill #0

## 2022-05-23 MED ORDER — CEPHALEXIN 250 MG PO CAPS
1000.0000 mg | ORAL_CAPSULE | Freq: Once | ORAL | Status: AC
  Administered 2022-05-23: 1000 mg via ORAL
  Filled 2022-05-23: qty 4

## 2022-05-23 NOTE — ED Provider Notes (Signed)
[facility] EMERGENCY DEPT Provider Note   CSN: [id number] Arrival date & time: 05/23/22  0805     History  Chief Complaint  Patient presents with   hand swollen    Charles Stone is a 56 y.o. male.  Patient c/o swollen, bruised, sore area to dorsum right hand. First noticed a couple days ago, small area to dorsum of left hand. States he noticed after he bumped it on something, but state feels he didn't hit it hard enough to cause bruising or swelling, so questions whether possible insect bite prior. Denies working outside, in yard or garden, or any possible snake bite. Denies seeing any specific spider or other insect. No other skin lesions or area of pain or swelling. No fever or chills. No other abnormal bruising or bleeding. No anticoag use.   The history is provided by the patient and medical records.       Home Medications Prior to Admission medications   Medication Sig Start Date End Date Taking? Authorizing Provider  cetirizine (ZYRTEC) 10 MG tablet Take 10 mg by mouth daily.    [provider]  furosemide (LASIX) 40 MG tablet Take 40 mg by mouth daily. 04/27/22   [provider]  naproxen (NAPROSYN) 500 MG tablet Take 1 tablet (500 mg total) by mouth 2 (two) times daily. 04/01/21   Carlisle Cater, PA-C  oxyCODONE-acetaminophen (PERCOCET/ROXICET) 5-325 MG tablet Take 1 tablet by mouth every 6 (six) hours as needed for severe pain. 04/01/21   Carlisle Cater, PA-C  PROAIR HFA 108 (90 BASE) MCG/ACT inhaler Inhale 1 puff into the lungs every 6 (six) hours as needed for wheezing or shortness of breath.  07/27/13   [provider]  spironolactone (ALDACTONE) 100 MG tablet Take 100 mg by mouth daily. 04/27/22   [provider]      Allergies    Patient has no known allergies.    Review of Systems   Review of Systems  Constitutional:  Negative for chills and fever.  HENT:  Negative for sore throat.   Eyes:  Negative for redness.   Respiratory:  Negative for cough and shortness of breath.   Cardiovascular:  Negative for chest pain.  Gastrointestinal:  Negative for abdominal pain, nausea and vomiting.  Genitourinary:  Negative for flank pain.  Musculoskeletal:  Negative for arthralgias and myalgias.  Skin:  Negative for rash.  Neurological:  Negative for weakness, numbness and headaches.  Hematological:  Does not bruise/bleed easily.  Psychiatric/Behavioral:  Negative for confusion.     Physical Exam Updated Vital Signs BP (!) 147/91   Pulse 83   Temp 98.1 F (36.7 C) (Oral)   Resp 16   SpO2 100%  Physical Exam Vitals and nursing note reviewed.  Constitutional:      Appearance: Normal appearance. He is well-developed.  HENT:     Head: Atraumatic.     Nose: Nose normal.     Mouth/Throat:     Mouth: Mucous membranes are moist.  Eyes:     General: No scleral icterus.    Conjunctiva/sclera: Conjunctivae normal.  Neck:     Trachea: No tracheal deviation.  Cardiovascular:     Rate and Rhythm: Normal rate.     Pulses: Normal pulses.  Pulmonary:     Effort: Pulmonary effort is normal. No accessory muscle usage or respiratory distress.  Genitourinary:    Comments: No cva tenderness. Musculoskeletal:     Cervical back: Neck supple.     Comments: Mild  swelling and bruising to dorsum left hand that appears centered in area of proximal 1st/2nd metacarpal. No fluctuance/abscess noted. No pain w passive rom digits or wrist. No focal scaphoid tenderness. Some bruising about skin of surrounding area. Mild erythema and increased warmth to area. No crepitus. Radial pulse 2+. Normal cap refill in fingers. No specific bite/sting wound noted. No fb seen or felt.   Skin:    General: Skin is warm and dry.     Findings: No rash.  Neurological:     Mental Status: He is alert.     Comments: Alert, speech clear.  Right hand nvi, w intact motor/sens fxn.   Psychiatric:        Mood and Affect: Mood normal.     ED  Results / Procedures / Treatments   Labs (all labs ordered are listed, but only abnormal results are displayed) Labs Reviewed  CBC  COMPREHENSIVE METABOLIC PANEL    EKG None  Radiology DG Hand Complete Right  Result Date: 05/22/2022 CLINICAL DATA:  Trauma, right hand swelling and bruising. Possible spider bite. EXAM: RIGHT HAND - COMPLETE 3+ VIEW COMPARISON:  None Available. FINDINGS: There is no evidence of fracture or dislocation. There is no evidence of arthropathy or other focal bone abnormality. Soft tissue swelling is present over the dorsum of the hand and wrist. IMPRESSION: No acute osseous abnormality. Electronically Signed   By: Brett Fairy M.D.   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[place]): chronic illness or injury that poses a threat to life or bodily functions Bruise: acute illness or injury Cellulitis of right hand: acute illness or injury Chronic anemia: chronic illness or injury with exacerbation, progression, or side effects of treatment that poses a threat to life or bodily functions Elevated blood pressure reading: acute illness or injury Elevated liver function tests: chronic illness or injury Hypoalbuminemia: chronic illness or injury Swelling of right hand: acute illness or injury Thrombocytopenia ([place]): acute illness or injury  Amount and/or Complexity of Data Reviewed External Data Reviewed: labs and notes. Labs: ordered. Decision-making details documented in ED Course. Radiology: independent interpretation performed. Decision-making details documented in ED Course.  Risk Prescription drug management.   Imaging done yesterday of same - reviewed/interpreted by me - no fx.   Reviewed  nursing notes and prior charts for additional history.   Confirmed nkda w pt.   Labs reviewed/interpreted by me - plt low, anemia. Elev lfts (prior GI f/u for liver disease noted in Epic).  Pt reports since prior eval for same - 'feels great', denies abd pain or distension, no weakness/faintness. No recent blood loss, nv, rectal bleeding or melena.   Keflex po.   Pt indicates has pcp (K Corrington) that he can f/u with re liver disease, anemia, abn labs, and f/u current symptoms. Anemia panel added - will have pt f/u via pcp.   Return precautions provided.            Final Clinical Impression(s) / ED Diagnoses Final diagnoses:  None    Rx / DC Orders ED Discharge Orders     None  Lajean Saver, MD 05/23/22 1004

## 2022-05-23 NOTE — ED Notes (Signed)
Patient verbalizes understanding of discharge instructions. Opportunity for questioning and answers were provided. Patient discharged from ED.  °

## 2022-05-23 NOTE — ED Notes (Signed)
Call times 3 with no answer.

## 2022-05-23 NOTE — ED Triage Notes (Signed)
Pt returns, left last night AMA, right hand bruising, swollen.concerned for spider bite.

## 2022-05-23 NOTE — Discharge Instructions (Addendum)
It was our pleasure to provide your ER care today - we hope that you feel better.  Elevate area. Icepack/coldpack to sore area. Take keflex (antibiotic) as prescribed.  From your lab tests, your liver function tests are elevated, your platelet count and blood count are low, and albumin level low - for these liver and lab issues and recent symptoms, follow up closely with your primary care doctor in one week.  We also sent an anemia panel the results of which are pending - have your doctor follow up on those results then. Also have your blood pressure rechecked then, as it is high today.  Return to ER if worse, new symptoms, fevers, severe pain, spreading redness, severe swelling, weak/faint, or other concern.

## 2022-05-24 LAB — VITAMIN B12: Vitamin B-12: 1363 pg/mL — ABNORMAL HIGH (ref 180–914)

## 2022-05-24 LAB — IRON AND TIBC
Iron: 180 ug/dL (ref 45–182)
Saturation Ratios: 93 % — ABNORMAL HIGH (ref 17.9–39.5)
TIBC: 195 ug/dL — ABNORMAL LOW (ref 250–450)
UIBC: 15 ug/dL

## 2022-08-19 ENCOUNTER — Encounter: Payer: Self-pay | Admitting: Cardiology

## 2022-08-19 ENCOUNTER — Ambulatory Visit: Payer: BC Managed Care – PPO | Attending: Cardiology | Admitting: Cardiology

## 2022-08-19 VITALS — BP 158/62 | HR 92 | Ht 69.0 in | Wt 211.8 lb

## 2022-08-19 DIAGNOSIS — R011 Cardiac murmur, unspecified: Secondary | ICD-10-CM

## 2022-08-19 DIAGNOSIS — K7031 Alcoholic cirrhosis of liver with ascites: Secondary | ICD-10-CM | POA: Diagnosis not present

## 2022-08-19 DIAGNOSIS — R079 Chest pain, unspecified: Secondary | ICD-10-CM

## 2022-08-19 DIAGNOSIS — I10 Essential (primary) hypertension: Secondary | ICD-10-CM | POA: Diagnosis not present

## 2022-08-19 NOTE — Patient Instructions (Signed)
Medication Instructions:  Talk to your liver doctor about restarting lasix and spironolactone  *If you need a refill on your cardiac medications before your next appointment, please call your pharmacy*  Testing/Procedures: Your physician has requested that you have an echocardiogram. Echocardiography is a painless test that uses sound waves to create images of your heart. It provides your doctor with information about the size and shape of your heart and how well your heart's chambers and valves are working. This procedure takes approximately one hour. There are no restrictions for this procedure. Please do NOT wear cologne, perfume, aftershave, or lotions (deodorant is allowed). Please arrive 15 minutes prior to your appointment time.  Follow-Up: At [facility], you and your health needs are our priority.  As part of our continuing mission to provide you with exceptional heart care, we have created designated Provider Care Teams.  These Care Teams include your primary Cardiologist (physician) and Advanced Practice Providers (APPs -  Physician Assistants and Nurse Practitioners) who all work together to provide you with the care you need, when you need it.  We recommend signing up for the patient portal called "MyChart".  Sign up information is provided on this After Visit Summary.  MyChart is used to connect with patients for Virtual Visits (Telemedicine).  Patients are able to view lab/test results, encounter notes, upcoming appointments, etc.  Non-urgent messages can be sent to your provider as well.   To learn more about what you can do with MyChart, go to [email or web address].    Your next appointment:   1 month(s) (after echo completed)   Provider:   Dr. Gardiner Rhyme

## 2022-08-19 NOTE — Progress Notes (Signed)
Cardiology Office Note:    Date:  08/19/2022   ID:  Charles Stone, DOB October 17, 1965, MRN [id number]  PCP:  April Manson, NP  Cardiologist:  None  Electrophysiologist:  None   Referring MD: April Manson, NP   Chief Complaint  Patient presents with   Heart Murmur    History of Present Illness:    Charles Stone is a 57 y.o. male with a hx of asthma, alcohol abuse, cirrhosis who is referred by Kathlen Brunswick, NP for evaluation of heart murmur.  He reports chest tightness occurring about 1-2 times per week.  States that whole chest feels tight.  Does not occur with exertion, occurs at rest.  Also reports shortness of breath.  Denies any syncope, lightheadedness, or palpitations.  Does report he has been having lower extremity edema.  He was recently taken off his Lasix and spironolactone and he reports worsening edema.  No smoking history.  Previously was drinking 4-5 mixed drinks per night but quit on 07/28/2022.  No known family history of heart disease.  Echocardiogram in 07/2013 showed EF 55 to 60%, grade 1 diastolic dysfunction, lipomatous hypertrophy of interatrial septum.  Normal Myoview on 07/2013.   Past Medical History:  Diagnosis Date   Asthma    Chest pain     Past Surgical History:  Procedure Laterality Date   right foot surgery  1995 & 1996   spine sx  2001 and 07/2012    Current Medications: Current Meds  Medication Sig   cetirizine (ZYRTEC) 10 MG tablet Take 10 mg by mouth daily.   PROAIR HFA 108 (90 BASE) MCG/ACT inhaler Inhale 1 puff into the lungs every 6 (six) hours as needed for wheezing or shortness of breath.      Allergies:   Patient has no known allergies.   Social History   Socioeconomic History   Marital status: Single    Spouse name: Not on file   Number of children: Not on file   Years of education: Not on file   Highest education level: Not on file  Occupational History   Not on file  Tobacco Use   Smoking status: Never   Smokeless  tobacco: Never  Vaping Use   Vaping Use: Never used  Substance and Sexual Activity   Alcohol use: Yes    Alcohol/week: 20.0 standard drinks of alcohol    Types: 20 Shots of liquor per week    Comment: 4 liquor drinks per day   Drug use: Never   Sexual activity: Not on file  Other Topics Concern   Not on file  Social History Narrative   Not on file   Social Determinants of Health   Financial Resource Strain: Not on file  Food Insecurity: Not on file  Transportation Needs: Not on file  Physical Activity: Not on file  Stress: Not on file  Social Connections: Not on file     Family History: The patient's family history includes Alcohol abuse in his father and mother; Cancer in his mother; Diabetes in his maternal grandmother; Heart attack in his maternal grandfather; Hypertension in his sister.  ROS:   Please see the history of present illness.     All other systems reviewed and are negative.  EKGs/Labs/Other Studies Reviewed:    The following studies were reviewed today:   EKG:   08/19/2022: Normal sinus rhythm, rate 92, no ST abnormalities  Recent Labs: 05/23/2022: ALT 19; BUN 15; Creatinine, Ser 1.12; Hemoglobin 9.3; Platelets  98; Potassium 3.6; Sodium 132  Recent Lipid Panel No results found for: "CHOL", "TRIG", "HDL", "CHOLHDL", "VLDL", "LDLCALC", "LDLDIRECT"  Physical Exam:    VS:  BP (!) 158/62   Pulse 92   Ht 5\' 9"  (1.753 m)   Wt 211 lb 12.8 oz (96.1 kg)   SpO2 100%   BMI 31.28 kg/m     Wt Readings from Last 3 Encounters:  08/19/22 211 lb 12.8 oz (96.1 kg)  05/22/22 180 lb (81.6 kg)  08/18/21 215 lb (97.5 kg)     GEN:  Well nourished, well developed in no acute distress HEENT: Normal NECK: No JVD; No carotid bruits LYMPHATICS: No lymphadenopathy CARDIAC: RRR, 3 out of 6 systolic murmur RESPIRATORY:  Clear to auscultation without rales, wheezing or rhonchi  ABDOMEN: Soft, non-tender, non-distended MUSCULOSKELETAL: 1+ bilateral lower extremity  edema SKIN: Warm and dry NEUROLOGIC:  Alert and oriented x 3 PSYCHIATRIC:  Normal affect   ASSESSMENT:    1. Heart murmur   2. Chest pain of uncertain etiology   3. Essential hypertension   4. Alcoholic cirrhosis of liver with ascites (HCC)    PLAN:    Heart murmur: 3/6 systolic murmur loudest at RUSB.  Check echocardiogram  Chest pain: Atypical in description, not related to exertion.  Could be related to valvular disease as above.  Will follow-up with echocardiogram and plan possible ischemic evaluation depending on results  Cirrhosis: Follows with gastroenterology at [facility].  Was on Lasix 40 mg daily and Aldactone 100 mg daily but stopped earlier this month due to AKI (Cr up to 1.44 on 08/05/22).  Resolved with holding diuretics (Cr 1.0 on 08/13/22).  He reports she has developed lower extremity edema since stopping his diuretics.  Recommend discussing with his hepatologist, would consider restarting Lasix and spironolactone at a lower dose  Hypertension: BP elevated in clinic today.  Likely related to volume overload, recommend discussing with his hematologist about restarting his Lasix/spironolactone  RTC in 1 month   Medication Adjustments/Labs and Tests Ordered: Current medicines are reviewed at length with the patient today.  Concerns regarding medicines are outlined above.  Orders Placed This Encounter  Procedures   EKG 12-Lead   ECHOCARDIOGRAM COMPLETE   No orders of the defined types were placed in this encounter.   Patient Instructions  Medication Instructions:  Talk to your liver doctor about restarting lasix and spironolactone  *If you need a refill on your cardiac medications before your next appointment, please call your pharmacy*  Testing/Procedures: Your physician has requested that you have an echocardiogram. Echocardiography is a painless test that uses sound waves to create images of your heart. It provides your doctor with information about the size and  shape of your heart and how well your heart's chambers and valves are working. This procedure takes approximately one hour. There are no restrictions for this procedure. Please do NOT wear cologne, perfume, aftershave, or lotions (deodorant is allowed). Please arrive 15 minutes prior to your appointment time.  Follow-Up: At [facility], you and your health needs are our priority.  As part of our continuing mission to provide you with exceptional heart care, we have created designated Provider Care Teams.  These Care Teams include your primary Cardiologist (physician) and Advanced Practice Providers (APPs -  Physician Assistants and Nurse Practitioners) who all work together to provide you with the care you need, when you need it.  We recommend signing up for the patient portal called "MyChart".  Sign up information  is provided on this After Visit Summary.  MyChart is used to connect with patients for Virtual Visits (Telemedicine).  Patients are able to view lab/test results, encounter notes, upcoming appointments, etc.  Non-urgent messages can be sent to your provider as well.   To learn more about what you can do with MyChart, go to [email or web address].    Your next appointment:   1 month(s) (after echo completed)   Provider:   Dr. Bjorn Pippin    Signed, Little Ishikawa, MD  08/19/2022 1:04 PM    [facility]

## 2022-09-02 ENCOUNTER — Ambulatory Visit (INDEPENDENT_AMBULATORY_CARE_PROVIDER_SITE_OTHER): Payer: BC Managed Care – PPO

## 2022-09-02 DIAGNOSIS — R011 Cardiac murmur, unspecified: Secondary | ICD-10-CM

## 2022-09-02 LAB — ECHOCARDIOGRAM COMPLETE
AR max vel: 2.11 cm2
AV Area VTI: 2.1 cm2
AV Area mean vel: 2.04 cm2
AV Mean grad: 8 mmHg
AV Peak grad: 13.8 mmHg
Ao pk vel: 1.86 m/s
Area-P 1/2: 4.29 cm2
MV M vel: 3.85 m/s
MV Peak grad: 59.3 mmHg
S' Lateral: 2.93 cm

## 2022-09-03 NOTE — Progress Notes (Unsigned)
Cardiology Office Note:    Date:  09/05/2022   ID:  Charles Stone, DOB 10/04/1965, MRN [id number]  PCP:  April Manson, NP  Cardiologist:  None  Electrophysiologist:  None   Referring MD: April Manson, NP   Chief Complaint  Patient presents with   Chest Pain    History of Present Illness:    Charles Stone is a 57 y.o. male with a hx of asthma, alcohol abuse, cirrhosis who is referred by Kathlen Brunswick, NP for evaluation of heart murmur.  He reports chest tightness occurring about 1-2 times per week.  States that whole chest feels tight.  Does not occur with exertion, occurs at rest.  Also reports shortness of breath.  Denies any syncope, lightheadedness, or palpitations.  Does report he has been having lower extremity edema.  He was recently taken off his Lasix and spironolactone and he reports worsening edema.  No smoking history.  Previously was drinking 4-5 mixed drinks per night but quit on 07/28/2022.  No known family history of heart disease.  Echocardiogram in 07/2013 showed EF 55 to 60%, grade 1 diastolic dysfunction, lipomatous hypertrophy of interatrial septum.  Normal Myoview on 07/2013.  Echocardiogram 09/02/2022 showed normal biventricular function, aortic valve sclerosis.  Since last clinic visit, he reports he has been doing well.  Has been having issues recently with sinus infection, currently on antibiotics.  Reports chest pain has improved.  Denies any shortness of breath.  He is taking spironolactone 50 mg and Lasix 20 mg now, has lost 10 pounds.  BP Readings from Last 3 Encounters:  09/05/22 (!) 124/56  08/19/22 (!) 158/62  05/23/22 123/63    Wt Readings from Last 3 Encounters:  09/05/22 201 lb 9.6 oz (91.4 kg)  08/19/22 211 lb 12.8 oz (96.1 kg)  05/22/22 180 lb (81.6 kg)      Past Medical History:  Diagnosis Date   Asthma    Chest pain     Past Surgical History:  Procedure Laterality Date   right foot surgery  1995 & 1996   spine sx  2001 and 07/2012     Current Medications: Current Meds  Medication Sig   amoxicillin-clavulanate (AUGMENTIN) 875-125 MG tablet Take 1 tablet by mouth 2 (two) times daily.   cetirizine (ZYRTEC) 10 MG tablet Take 10 mg by mouth daily.   furosemide (LASIX) 20 MG tablet Take 20 mg by mouth daily.   metoprolol tartrate (LOPRESSOR) 100 MG tablet Take 1 tablet (100mg ) TWO hours prior to CT scan   spironolactone (ALDACTONE) 50 MG tablet Take 50 mg by mouth daily.     Allergies:   Patient has no known allergies.   Social History   Socioeconomic History   Marital status: Single    Spouse name: Not on file   Number of children: Not on file   Years of education: Not on file   Highest education level: Not on file  Occupational History   Not on file  Tobacco Use   Smoking status: Never   Smokeless tobacco: Never  Vaping Use   Vaping Use: Never used  Substance and Sexual Activity   Alcohol use: Yes    Alcohol/week: 20.0 standard drinks of alcohol    Types: 20 Shots of liquor per week    Comment: 4 liquor drinks per day   Drug use: Never   Sexual activity: Not on file  Other Topics Concern   Not on file  Social History Narrative  Not on file   Social Determinants of Health   Financial Resource Strain: Not on file  Food Insecurity: Not on file  Transportation Needs: Not on file  Physical Activity: Not on file  Stress: Not on file  Social Connections: Not on file     Family History: The patient's family history includes Alcohol abuse in his father and mother; Cancer in his mother; Diabetes in his maternal grandmother; Heart attack in his maternal grandfather; Hypertension in his sister.  ROS:   Please see the history of present illness.     All other systems reviewed and are negative.  EKGs/Labs/Other Studies Reviewed:    The following studies were reviewed today:   EKG:   08/19/2022: Normal sinus rhythm, rate 92, no ST abnormalities  Recent Labs: 05/23/2022: ALT 19; BUN 15;  Creatinine, Ser 1.12; Hemoglobin 9.3; Platelets 98; Potassium 3.6; Sodium 132  Recent Lipid Panel No results found for: "CHOL", "TRIG", "HDL", "CHOLHDL", "VLDL", "LDLCALC", "LDLDIRECT"  Physical Exam:    VS:  BP (!) 124/56 (BP Location: Left Arm, Patient Position: Sitting, Cuff Size: Large)   Pulse 93   Ht 5\' 9"  (1.753 m)   Wt 201 lb 9.6 oz (91.4 kg)   SpO2 98%   BMI 29.77 kg/m     Wt Readings from Last 3 Encounters:  09/05/22 201 lb 9.6 oz (91.4 kg)  08/19/22 211 lb 12.8 oz (96.1 kg)  05/22/22 180 lb (81.6 kg)     GEN:  Well nourished, well developed in no acute distress HEENT: Normal NECK: No JVD; No carotid bruits LYMPHATICS: No lymphadenopathy CARDIAC: RRR, 2 out of 6 systolic murmur RESPIRATORY:  Clear to auscultation without rales, wheezing or rhonchi  ABDOMEN: Soft, non-tender, non-distended MUSCULOSKELETAL: Trace bilateral lower extremity edema SKIN: Warm and dry NEUROLOGIC:  Alert and oriented x 3 PSYCHIATRIC:  Normal affect   ASSESSMENT:    1. Chest pain of uncertain etiology   2. Heart murmur   3. Lipid screening   4. Essential hypertension     PLAN:    Chest pain: Atypical in description, but does have CAD risk factors (age, hypertension)..  Recommend coronary CT to rule out obstructive CAD.  Will give Lopressor 100 mg prior to study.   Echocardiogram 09/02/2022 showed normal biventricular function, aortic valve sclerosis.  Heart murmur: 3/6 systolic murmur loudest at RUSB.  Echocardiogram 09/02/2022 shows aortic valve sclerosis, no stenosis  Cirrhosis: Follows with gastroenterology at [facility].  Was on Lasix 40 mg daily and Aldactone 100 mg daily but stopped earlier this month due to AKI (Cr up to 1.44 on 08/05/22).  Resolved with holding diuretics (Cr 1.0 on 08/13/22).  He reports he has developed lower extremity edema since stopping his diuretics.  Now back on Lasix 20 mg daily and spironolactone 50 mg daily.  Has lost 10 pounds and edema improved  Hypertension:  On Lasix 20 mg daily, spironolactone 50 mg daily.  Appears controlled  Lipid screening: Check lipid panel  RTC in 3 months   Medication Adjustments/Labs and Tests Ordered: Current medicines are reviewed at length with the patient today.  Concerns regarding medicines are outlined above.  Orders Placed This Encounter  Procedures   CT CORONARY MORPH W/CTA COR W/SCORE W/CA W/CM &/OR WO/CM   Basic metabolic panel   Lipid panel   Magnesium   Meds ordered this encounter  Medications   metoprolol tartrate (LOPRESSOR) 100 MG tablet    Sig: Take 1 tablet (100mg ) TWO hours prior to CT scan  Dispense:  1 tablet    Refill:  0    Patient Instructions  Medication Instructions:  Your physician recommends that you continue on your current medications as directed. Please refer to the Current Medication list given to you today.  *If you need a refill on your cardiac medications before your next appointment, please call your pharmacy*  Lab Work: BMET, Lipid, Mag today  If you have labs (blood work) drawn today and your tests are completely normal, you will receive your results only by: MyChart Message (if you have MyChart) OR A paper copy in the mail If you have any lab test that is abnormal or we need to change your treatment, we will call you to review the results.   Testing/Procedures: Coronary CTA-see instructions below  Follow-Up: At [facility], you and your health needs are our priority.  As part of our continuing mission to provide you with exceptional heart care, we have created designated Provider Care Teams.  These Care Teams include your primary Cardiologist (physician) and Advanced Practice Providers (APPs -  Physician Assistants and Nurse Practitioners) who all work together to provide you with the care you need, when you need it.  We recommend signing up for the patient portal called "MyChart".  Sign up information is provided on this After Visit Summary.  MyChart  is used to connect with patients for Virtual Visits (Telemedicine).  Patients are able to view lab/test results, encounter notes, upcoming appointments, etc.  Non-urgent messages can be sent to your provider as well.   To learn more about what you can do with MyChart, go to [email or web address].    Your next appointment:   3 month(s)  Provider:   Dr. Bjorn Pippin Other Instructions   Your cardiac CT will be scheduled at one of the below locations:   [facility] [address] [place], [place] [address] [phone]   If scheduled at [facility], please arrive at the [facility] and Children's Entrance (Entrance C2) of [facility] 30 minutes prior to test start time. You can use the FREE valet parking offered at entrance C (encouraged to control the heart rate for the test)  Proceed to the [facility] Radiology Department (first floor) to check-in and test prep.  All radiology patients and guests should use entrance C2 at [facility], accessed from [facility], even though the hospital's physical address listed is [address]      Please follow these instructions carefully (unless otherwise directed):  Hold all erectile dysfunction medications at least 3 days (72 hrs) prior to test. (Ie viagra, cialis, sildenafil, tadalafil, etc) We will administer nitroglycerin during this exam.   On the Night Before the Test: Be sure to Drink plenty of water. Do not consume any caffeinated/decaffeinated beverages or chocolate 12 hours prior to your test. Do not take any antihistamines 12 hours prior to your test.  On the Day of the Test: Drink plenty of water until 1 hour prior to the test. Do not eat any food 1 hour prior to test. You may take your regular medications prior to the test.  Take metoprolol 100 mg (Lopressor) two hours prior to test. If you take Furosemide/Hydrochlorothiazide/Spironolactone, please HOLD on the morning of  the test.      After the Test: Drink plenty of water. After receiving IV contrast, you may experience a mild flushed feeling. This is normal. On occasion, you may experience a mild rash up to 24  hours after the test. This is not dangerous. If this occurs, you can take Benadryl 25 mg and increase your fluid intake. If you experience trouble breathing, this can be serious. If it is severe call 911 IMMEDIATELY. If it is mild, please call our office. If you take any of these medications: Glipizide/Metformin, Avandament, Glucavance, please do not take 48 hours after completing test unless otherwise instructed.  We will call to schedule your test 2-4 weeks out understanding that some insurance companies will need an authorization prior to the service being performed.   For non-scheduling related questions, please contact the cardiac imaging nurse navigator should you have any questions/concerns: Rockwell Alexandria, Cardiac Imaging Nurse Navigator Larey Brick, Cardiac Imaging Nurse Navigator [facility] Direct Office Dial: [phone]   For scheduling needs, including cancellations and rescheduling, please call [place], [phone].     Signed, Little Ishikawa, MD  09/05/2022 5:17 PM    [facility]

## 2022-09-05 ENCOUNTER — Encounter: Payer: Self-pay | Admitting: Cardiology

## 2022-09-05 ENCOUNTER — Ambulatory Visit: Payer: BC Managed Care – PPO | Attending: Cardiology | Admitting: Cardiology

## 2022-09-05 VITALS — BP 124/56 | HR 93 | Ht 69.0 in | Wt 201.6 lb

## 2022-09-05 DIAGNOSIS — I10 Essential (primary) hypertension: Secondary | ICD-10-CM | POA: Diagnosis not present

## 2022-09-05 DIAGNOSIS — R011 Cardiac murmur, unspecified: Secondary | ICD-10-CM

## 2022-09-05 DIAGNOSIS — R079 Chest pain, unspecified: Secondary | ICD-10-CM | POA: Diagnosis not present

## 2022-09-05 DIAGNOSIS — Z13220 Encounter for screening for lipoid disorders: Secondary | ICD-10-CM | POA: Diagnosis not present

## 2022-09-05 MED ORDER — METOPROLOL TARTRATE 100 MG PO TABS: ORAL_TABLET | ORAL | 0 refills | Status: DC

## 2022-09-05 NOTE — Patient Instructions (Addendum)
Medication Instructions:  Your physician recommends that you continue on your current medications as directed. Please refer to the Current Medication list given to you today.  *If you need a refill on your cardiac medications before your next appointment, please call your pharmacy*  Lab Work: BMET, Lipid, Mag today  If you have labs (blood work) drawn today and your tests are completely normal, you will receive your results only by: [place] (if you have MyChart) OR A paper copy in the mail If you have any lab test that is abnormal or we need to change your treatment, we will call you to review the results.   Testing/Procedures: Coronary CTA-see instructions below  Follow-Up: At [facility], you and your health needs are our priority.  As part of our continuing mission to provide you with exceptional heart care, we have created designated Provider Care Teams.  These Care Teams include your primary Cardiologist (physician) and Advanced Practice Providers (APPs -  Physician Assistants and Nurse Practitioners) who all work together to provide you with the care you need, when you need it.  We recommend signing up for the patient portal called "MyChart".  Sign up information is provided on this After Visit Summary.  MyChart is used to connect with patients for Virtual Visits (Telemedicine).  Patients are able to view lab/test results, encounter notes, upcoming appointments, etc.  Non-urgent messages can be sent to your provider as well.   To learn more about what you can do with MyChart, go to [email or web address].    Your next appointment:   3 month(s)  Provider:   Dr. Gardiner Rhyme Other Instructions   Your cardiac CT will be scheduled at one of the below locations:   [facility] [address] [place], [place] [address] [phone]   If scheduled at [facility], please arrive at the [facility] and Children's Entrance (Entrance C2) of [facility] 30 minutes prior to test start time. You can use the FREE valet parking offered at entrance C (encouraged to control the heart rate for the test)  Proceed to the [facility] Radiology Department (first floor) to check-in and test prep.  All radiology patients and guests should use entrance C2 at [facility], accessed from [facility], even though the hospital's physical address listed is [address]      Please follow these instructions carefully (unless otherwise directed):  Hold all erectile dysfunction medications at least 3 days (72 hrs) prior to test. (Ie viagra, cialis, sildenafil, tadalafil, etc) We will administer nitroglycerin during this exam.   On the Night Before the Test: Be sure to Drink plenty of water. Do not consume any caffeinated/decaffeinated beverages or chocolate 12 hours prior to your test. Do not take any antihistamines 12 hours prior to your test.  On the Day of the Test: Drink plenty of water until 1 hour prior to the test. Do not eat any food 1 hour prior to test. You may take your regular medications prior to the test.  Take metoprolol 100 mg (Lopressor) two hours prior to test. If you take Furosemide/Hydrochlorothiazide/Spironolactone, please HOLD on the morning of the test.      After the Test: Drink plenty of water. After receiving IV contrast, you may experience a mild flushed feeling. This is normal. On occasion, you may experience a mild rash up to 24 hours after the test. This is not dangerous. If this occurs, you can take Benadryl 25 mg  and increase your fluid intake. If you experience trouble breathing, this can be serious. If it is severe call 911 IMMEDIATELY. If it is mild, please call our office. If you take any of these medications: Glipizide/Metformin, Avandament, Glucavance, please do not take 48 hours after completing test unless otherwise instructed.  We will call to schedule your test 2-4 weeks out  understanding that some insurance companies will need an authorization prior to the service being performed.   For non-scheduling related questions, please contact the cardiac imaging nurse navigator should you have any questions/concerns: Marchia Bond, Cardiac Imaging Nurse Navigator Gordy Clement, Cardiac Imaging Nurse Navigator [facility] [facility] Direct Office Dial: [phone]   For scheduling needs, including cancellations and rescheduling, please call [place], [phone].

## 2022-09-06 ENCOUNTER — Other Ambulatory Visit: Payer: Self-pay | Admitting: *Deleted

## 2022-09-06 LAB — BASIC METABOLIC PANEL
BUN/Creatinine Ratio: 13 (ref 9–20)
BUN: 14 mg/dL (ref 6–24)
CO2: 26 mmol/L (ref 20–29)
Calcium: 9.7 mg/dL (ref 8.7–10.2)
Chloride: 97 mmol/L (ref 96–106)
Creatinine, Ser: 1.1 mg/dL (ref 0.76–1.27)
Glucose: 110 mg/dL — ABNORMAL HIGH (ref 70–99)
Potassium: 4.2 mmol/L (ref 3.5–5.2)
Sodium: 135 mmol/L (ref 134–144)
eGFR: 77 mL/min/{1.73_m2} (ref >59)

## 2022-09-06 LAB — LIPID PANEL
Chol/HDL Ratio: 3.3 ratio (ref 0.0–5.0)
Cholesterol, Total: 183 mg/dL (ref 100–199)
HDL: 55 mg/dL (ref >39)
LDL Chol Calc (NIH): 105 mg/dL — ABNORMAL HIGH (ref 0–99)
Triglycerides: 133 mg/dL (ref 0–149)
VLDL Cholesterol Cal: 23 mg/dL (ref 5–40)

## 2022-09-06 LAB — MAGNESIUM: Magnesium: 1.6 mg/dL (ref 1.6–2.3)

## 2022-09-06 MED ORDER — MAGNESIUM OXIDE 400 MG PO TABS: 400.0000 mg | ORAL_TABLET | Freq: Every day | ORAL | 3 refills | Status: DC

## 2022-09-12 ENCOUNTER — Telehealth (HOSPITAL_COMMUNITY): Payer: Self-pay | Admitting: Emergency Medicine

## 2022-09-12 NOTE — Telephone Encounter (Signed)
Reaching out to patient to offer assistance regarding upcoming cardiac imaging study; pt verbalizes understanding of appt date/time, parking situation and where to check in, pre-test NPO status and medications ordered, and verified current allergies; name and call back number provided for further questions should they arise Marchia Bond RN Navigator Cardiac Imaging [facility] [phone] office [phone] cell  Arrival [address] entrance Denies iv issues 160m metoprolol tartrate Aware contrast/nitro

## 2022-09-13 ENCOUNTER — Ambulatory Visit (HOSPITAL_COMMUNITY)
Admission: RE | Admit: 2022-09-13 | Discharge: 2022-09-13 | Disposition: A | Discharge status: Home / Self Care | Payer: BC Managed Care – PPO | Source: Ambulatory Visit | Attending: Cardiology | Admitting: Cardiology

## 2022-09-13 DIAGNOSIS — R079 Chest pain, unspecified: Secondary | ICD-10-CM | POA: Diagnosis present

## 2022-09-13 MED ORDER — NITROGLYCERIN 0.4 MG SL SUBL
SUBLINGUAL_TABLET | SUBLINGUAL | Status: AC
  Filled 2022-09-13: qty 2

## 2022-09-13 MED ORDER — IOHEXOL 350 MG/ML SOLN
95.0000 mL | Freq: Once | INTRAVENOUS | Status: AC | PRN
  Administered 2022-09-13: 95 mL via INTRAVENOUS

## 2022-09-13 MED ORDER — NITROGLYCERIN 0.4 MG SL SUBL
0.8000 mg | SUBLINGUAL_TABLET | Freq: Once | SUBLINGUAL | Status: AC
  Administered 2022-09-13: 0.8 mg via SUBLINGUAL

## 2022-12-26 ENCOUNTER — Ambulatory Visit: Payer: BC Managed Care – PPO | Attending: Cardiology | Admitting: Cardiology

## 2022-12-26 ENCOUNTER — Encounter: Payer: Self-pay | Admitting: Cardiology

## 2022-12-26 VITALS — BP 138/58 | HR 89 | Ht 69.0 in | Wt 183.0 lb

## 2022-12-26 DIAGNOSIS — I10 Essential (primary) hypertension: Secondary | ICD-10-CM

## 2022-12-26 DIAGNOSIS — R079 Chest pain, unspecified: Secondary | ICD-10-CM

## 2022-12-26 DIAGNOSIS — R011 Cardiac murmur, unspecified: Secondary | ICD-10-CM | POA: Diagnosis not present

## 2022-12-26 DIAGNOSIS — E785 Hyperlipidemia, unspecified: Secondary | ICD-10-CM | POA: Diagnosis not present

## 2022-12-26 NOTE — Progress Notes (Signed)
Cardiology Office Note:    Date:  12/26/2022   ID:  Charles Stone, DOB 02/08/1966, MRN [id number]  PCP:  April Manson, NP  Cardiologist:  Little Ishikawa, MD  Electrophysiologist:  None   Referring MD: April Manson, NP   Chief Complaint  Patient presents with   Edema    History of Present Illness:    Charles Stone is a 57 y.o. male with a hx of asthma, alcohol abuse, cirrhosis who presents for follow-up.  He was referred by Kathlen Brunswick, NP for evaluation of heart murmur, initially seen 08/19/2022.  He reports chest tightness occurring about 1-2 times per week.  States that whole chest feels tight.  Does not occur with exertion, occurs at rest.  Also reports shortness of breath.  Denies any syncope, lightheadedness, or palpitations.  Does report he has been having lower extremity edema.  He was recently taken off his Lasix and spironolactone and he reports worsening edema.  No smoking history.  Previously was drinking 4-5 mixed drinks per night but quit on 07/28/2022.  No known family history of heart disease.  Echocardiogram in 07/2013 showed EF 55 to 60%, grade 1 diastolic dysfunction, lipomatous hypertrophy of interatrial septum.  Normal Myoview on 07/2013.  Echocardiogram 09/02/2022 showed normal biventricular function, aortic valve sclerosis.  Coronary CTA 09/13/2022 showed normal coronary arteries, calcium score 0.  Since last clinic visit, he reports he is doing well.  Weight is down 20 pounds from clinic visit 07/2022.  Reports lower extremity edema has improved.  He denies any chest pain or dyspnea.  Denies any lightheadedness, syncope.  Reports he is walking 8-10,000 steps per day.   BP Readings from Last 3 Encounters:  12/26/22 (!) 138/58  09/13/22 (!) 110/59  09/05/22 (!) 124/56    Wt Readings from Last 3 Encounters:  12/26/22 183 lb (83 kg)  09/05/22 201 lb 9.6 oz (91.4 kg)  08/19/22 211 lb 12.8 oz (96.1 kg)      Past Medical History:  Diagnosis Date   Asthma     Chest pain     Past Surgical History:  Procedure Laterality Date   right foot surgery  1995 & 1996   spine sx  2001 and 07/2012    Current Medications: Current Meds  Medication Sig   cetirizine (ZYRTEC) 10 MG tablet Take 10 mg by mouth daily.   furosemide (LASIX) 20 MG tablet Take 20 mg by mouth daily.   magnesium oxide (MAG-OX) 400 MG tablet Take 1 tablet (400 mg total) by mouth daily.   PROAIR HFA 108 (90 BASE) MCG/ACT inhaler Inhale 1 puff into the lungs every 6 (six) hours as needed for wheezing or shortness of breath.   spironolactone (ALDACTONE) 50 MG tablet Take 50 mg by mouth daily.     Allergies:   Patient has no known allergies.   Social History   Socioeconomic History   Marital status: Single    Spouse name: Not on file   Number of children: Not on file   Years of education: Not on file   Highest education level: Not on file  Occupational History   Not on file  Tobacco Use   Smoking status: Never   Smokeless tobacco: Never  Vaping Use   Vaping Use: Never used  Substance and Sexual Activity   Alcohol use: Yes    Alcohol/week: 20.0 standard drinks of alcohol    Types: 20 Shots of liquor per week    Comment: 4  liquor drinks per day   Drug use: Never   Sexual activity: Not on file  Other Topics Concern   Not on file  Social History Narrative   Not on file   Social Determinants of Health   Financial Resource Strain: Not on file  Food Insecurity: Not on file  Transportation Needs: Not on file  Physical Activity: Not on file  Stress: Not on file  Social Connections: Not on file     Family History: The patient's family history includes Alcohol abuse in his father and mother; Cancer in his mother; Diabetes in his maternal grandmother; Heart attack in his maternal grandfather; Hypertension in his sister.  ROS:   Please see the history of present illness.     All other systems reviewed and are negative.  EKGs/Labs/Other Studies Reviewed:    The  following studies were reviewed today:   EKG:   08/19/2022: Normal sinus rhythm, rate 92, no ST abnormalities  Recent Labs: 05/23/2022: ALT 19; Hemoglobin 9.3; Platelets 98 09/05/2022: BUN 14; Creatinine, Ser 1.10; Magnesium 1.6; Potassium 4.2; Sodium 135  Recent Lipid Panel    Component Value Date/Time   CHOL 183 09/05/2022 1500   TRIG 133 09/05/2022 1500   HDL 55 09/05/2022 1500   CHOLHDL 3.3 09/05/2022 1500   LDLCALC 105 (H) 09/05/2022 1500    Physical Exam:    VS:  BP (!) 138/58 (BP Location: Left Arm, Patient Position: Sitting, Cuff Size: Normal)   Pulse 89   Ht 5\' 9"  (1.753 m)   Wt 183 lb (83 kg)   SpO2 99%   BMI 27.02 kg/m     Wt Readings from Last 3 Encounters:  12/26/22 183 lb (83 kg)  09/05/22 201 lb 9.6 oz (91.4 kg)  08/19/22 211 lb 12.8 oz (96.1 kg)     GEN:  Well nourished, well developed in no acute distress HEENT: Normal NECK: No JVD; No carotid bruits LYMPHATICS: No lymphadenopathy CARDIAC: RRR, 2 out of 6 systolic murmur RESPIRATORY:  Clear to auscultation without rales, wheezing or rhonchi  ABDOMEN: Soft, non-tender, non-distended MUSCULOSKELETAL: Trace bilateral lower extremity edema SKIN: Warm and dry NEUROLOGIC:  Alert and oriented x 3 PSYCHIATRIC:  Normal affect   ASSESSMENT:    1. Chest pain of uncertain etiology   2. Essential hypertension   3. Heart murmur   4. Hyperlipidemia, unspecified hyperlipidemia type      PLAN:    Chest pain: Atypical in description.  Echocardiogram 09/02/2022 showed normal biventricular function, aortic valve sclerosis.  Coronary CTA 09/13/2022 showed normal coronary arteries, calcium score 0.  Heart murmur: 3/6 systolic murmur loudest at RUSB.  Echocardiogram 09/02/2022 shows aortic valve sclerosis, no stenosis  Cirrhosis: Follows with gastroenterology at [facility]  Was on Lasix 40 mg daily and Aldactone 100 mg daily but stopped 07/2022 due to AKI (Cr up to 1.44 on 08/05/22).  Resolved with holding diuretics (Cr 1.0  on 08/13/22).  He developed lower extremity edema since stopping his diuretics.  Now back on Lasix 20 mg daily and spironolactone 50 mg daily.  Has lost 28 pounds and edema improved -Check BMET, magnesium  Hypertension: On Lasix 20 mg daily, spironolactone 50 mg daily.  Appears controlled  Hyperlipidemia: LDL 105 on 09/05/2022.  Normal coronary arteries on CTA 08/2022 as above  RTC in 1 year   Medication Adjustments/Labs and Tests Ordered: Current medicines are reviewed at length with the patient today.  Concerns regarding medicines are outlined above.  Orders Placed This Encounter  Procedures  Basic metabolic panel   Magnesium   No orders of the defined types were placed in this encounter.   Patient Instructions  Medication Instructions:  Your physician recommends that you continue on your current medications as directed. Please refer to the Current Medication list given to you today.   *If you need a refill on your cardiac medications before your next appointment, please call your pharmacy*  Lab Work: BMET, Mag today  If you have labs (blood work) drawn today and your tests are completely normal, you will receive your results only by: MyChart Message (if you have MyChart) OR A paper copy in the mail If you have any lab test that is abnormal or we need to change your treatment, we will call you to review the results.  Follow-Up: At [facility], you and your health needs are our priority.  As part of our continuing mission to provide you with exceptional heart care, we have created designated Provider Care Teams.  These Care Teams include your primary Cardiologist (physician) and Advanced Practice Providers (APPs -  Physician Assistants and Nurse Practitioners) who all work together to provide you with the care you need, when you need it.  We recommend signing up for the patient portal called "MyChart".  Sign up information is provided on this After Visit Summary.  MyChart  is used to connect with patients for Virtual Visits (Telemedicine).  Patients are able to view lab/test results, encounter notes, upcoming appointments, etc.  Non-urgent messages can be sent to your provider as well.   To learn more about what you can do with MyChart, go to [email or web address].    Your next appointment:   12 month(s)  Provider:   Little Ishikawa, MD       Signed, Little Ishikawa, MD  12/26/2022 2:42 PM    [facility]

## 2022-12-26 NOTE — Patient Instructions (Signed)
Medication Instructions:  Your physician recommends that you continue on your current medications as directed. Please refer to the Current Medication list given to you today.   *If you need a refill on your cardiac medications before your next appointment, please call your pharmacy*  Lab Work: BMET, Mag today  If you have labs (blood work) drawn today and your tests are completely normal, you will receive your results only by: MyChart Message (if you have MyChart) OR A paper copy in the mail If you have any lab test that is abnormal or we need to change your treatment, we will call you to review the results.  Follow-Up: At [facility], you and your health needs are our priority.  As part of our continuing mission to provide you with exceptional heart care, we have created designated Provider Care Teams.  These Care Teams include your primary Cardiologist (physician) and Advanced Practice Providers (APPs -  Physician Assistants and Nurse Practitioners) who all work together to provide you with the care you need, when you need it.  We recommend signing up for the patient portal called "MyChart".  Sign up information is provided on this After Visit Summary.  MyChart is used to connect with patients for Virtual Visits (Telemedicine).  Patients are able to view lab/test results, encounter notes, upcoming appointments, etc.  Non-urgent messages can be sent to your provider as well.   To learn more about what you can do with MyChart, go to [email or web address].    Your next appointment:   12 month(s)  Provider:   Little Ishikawa, MD

## 2022-12-27 LAB — BASIC METABOLIC PANEL
BUN/Creatinine Ratio: 7 — ABNORMAL LOW (ref 9–20)
BUN: 10 mg/dL (ref 6–24)
CO2: 29 mmol/L (ref 20–29)
Calcium: 9.8 mg/dL (ref 8.7–10.2)
Chloride: 95 mmol/L — ABNORMAL LOW (ref 96–106)
Creatinine, Ser: 1.38 mg/dL — ABNORMAL HIGH (ref 0.76–1.27)
Glucose: 107 mg/dL — ABNORMAL HIGH (ref 70–99)
Potassium: 4.2 mmol/L (ref 3.5–5.2)
Sodium: 135 mmol/L (ref 134–144)
eGFR: 59 mL/min/{1.73_m2} — ABNORMAL LOW (ref >59)

## 2022-12-27 LAB — MAGNESIUM: Magnesium: 1.6 mg/dL (ref 1.6–2.3)

## 2023-01-01 ENCOUNTER — Other Ambulatory Visit: Payer: Self-pay

## 2023-01-02 ENCOUNTER — Other Ambulatory Visit: Payer: Self-pay

## 2023-01-02 DIAGNOSIS — I10 Essential (primary) hypertension: Secondary | ICD-10-CM

## 2023-01-10 ENCOUNTER — Encounter: Payer: Self-pay | Admitting: Cardiology

## 2023-01-11 LAB — BASIC METABOLIC PANEL
BUN/Creatinine Ratio: 10 (ref 9–20)
BUN: 11 mg/dL (ref 6–24)
CO2: 25 mmol/L (ref 20–29)
Calcium: 9.2 mg/dL (ref 8.7–10.2)
Chloride: 98 mmol/L (ref 96–106)
Creatinine, Ser: 1.05 mg/dL (ref 0.76–1.27)
Glucose: 175 mg/dL — ABNORMAL HIGH (ref 70–99)
Potassium: 3.9 mmol/L (ref 3.5–5.2)
Sodium: 133 mmol/L — ABNORMAL LOW (ref 134–144)
eGFR: 82 mL/min/{1.73_m2} (ref >59)

## 2023-01-11 LAB — MAGNESIUM: Magnesium: 1.6 mg/dL (ref 1.6–2.3)

## 2023-01-13 NOTE — Telephone Encounter (Signed)
I do not think we will be able to add it on at this point, would recommend PCP follow-up to check testosterone levels

## 2023-02-11 ENCOUNTER — Other Ambulatory Visit (HOSPITAL_BASED_OUTPATIENT_CLINIC_OR_DEPARTMENT_OTHER): Payer: Self-pay

## 2023-02-11 ENCOUNTER — Emergency Department (HOSPITAL_BASED_OUTPATIENT_CLINIC_OR_DEPARTMENT_OTHER): Payer: BC Managed Care – PPO

## 2023-02-11 ENCOUNTER — Encounter (HOSPITAL_BASED_OUTPATIENT_CLINIC_OR_DEPARTMENT_OTHER): Payer: Self-pay | Admitting: Emergency Medicine

## 2023-02-11 ENCOUNTER — Emergency Department (HOSPITAL_BASED_OUTPATIENT_CLINIC_OR_DEPARTMENT_OTHER)
Admission: EM | Admit: 2023-02-11 | Discharge: 2023-02-11 | Disposition: A | Discharge status: Home / Self Care | Payer: BC Managed Care – PPO | Attending: Emergency Medicine | Admitting: Emergency Medicine

## 2023-02-11 ENCOUNTER — Other Ambulatory Visit: Payer: Self-pay

## 2023-02-11 DIAGNOSIS — R319 Hematuria, unspecified: Secondary | ICD-10-CM | POA: Insufficient documentation

## 2023-02-11 DIAGNOSIS — K7031 Alcoholic cirrhosis of liver with ascites: Secondary | ICD-10-CM | POA: Diagnosis not present

## 2023-02-11 DIAGNOSIS — M7989 Other specified soft tissue disorders: Secondary | ICD-10-CM

## 2023-02-11 DIAGNOSIS — R600 Localized edema: Secondary | ICD-10-CM | POA: Insufficient documentation

## 2023-02-11 LAB — COMPREHENSIVE METABOLIC PANEL
ALT: 15 U/L (ref 0–44)
AST: 44 U/L — ABNORMAL HIGH (ref 15–41)
Albumin: 2.7 g/dL — ABNORMAL LOW (ref 3.5–5.0)
Alkaline Phosphatase: 101 U/L (ref 38–126)
Anion gap: 9 (ref 5–15)
BUN: 28 mg/dL — ABNORMAL HIGH (ref 6–20)
CO2: 24 mmol/L (ref 22–32)
Calcium: 8.9 mg/dL (ref 8.9–10.3)
Chloride: 96 mmol/L — ABNORMAL LOW (ref 98–111)
Creatinine, Ser: 1.63 mg/dL — ABNORMAL HIGH (ref 0.61–1.24)
GFR, Estimated: 48 mL/min — ABNORMAL LOW (ref >60)
Glucose, Bld: 124 mg/dL — ABNORMAL HIGH (ref 70–99)
Potassium: 4 mmol/L (ref 3.5–5.1)
Sodium: 129 mmol/L — ABNORMAL LOW (ref 135–145)
Total Bilirubin: 20 mg/dL (ref 0.3–1.2)
Total Protein: 6.8 g/dL (ref 6.5–8.1)

## 2023-02-11 LAB — URINALYSIS, ROUTINE W REFLEX MICROSCOPIC
Bacteria, UA: NONE SEEN
Cellular Cast, UA: 32
Glucose, UA: NEGATIVE mg/dL
Ketones, ur: NEGATIVE mg/dL
Leukocytes,Ua: NEGATIVE
Nitrite: NEGATIVE
Protein, ur: NEGATIVE mg/dL
Specific Gravity, Urine: 1.018 (ref 1.005–1.030)
pH: 6 (ref 5.0–8.0)

## 2023-02-11 LAB — CBC WITH DIFFERENTIAL/PLATELET
Abs Immature Granulocytes: 0.06 10*3/uL (ref 0.00–0.07)
Basophils Absolute: 0.1 10*3/uL (ref 0.0–0.1)
Basophils Relative: 1 %
Eosinophils Absolute: 0.1 10*3/uL (ref 0.0–0.5)
Eosinophils Relative: 1 %
HCT: 21.1 % — ABNORMAL LOW (ref 39.0–52.0)
Hemoglobin: 7.1 g/dL — ABNORMAL LOW (ref 13.0–17.0)
Immature Granulocytes: 1 %
Lymphocytes Relative: 8 %
Lymphs Abs: 1 10*3/uL (ref 0.7–4.0)
MCH: 41.5 pg — ABNORMAL HIGH (ref 26.0–34.0)
MCHC: 33.6 g/dL (ref 30.0–36.0)
MCV: 123.4 fL — ABNORMAL HIGH (ref 80.0–100.0)
Monocytes Absolute: 1.5 10*3/uL — ABNORMAL HIGH (ref 0.1–1.0)
Monocytes Relative: 13 %
Neutro Abs: 9.1 10*3/uL — ABNORMAL HIGH (ref 1.7–7.7)
Neutrophils Relative %: 76 %
Platelets: 76 10*3/uL — ABNORMAL LOW (ref 150–400)
RBC: 1.71 MIL/uL — ABNORMAL LOW (ref 4.22–5.81)
RDW: 14.7 % (ref 11.5–15.5)
WBC: 11.8 10*3/uL — ABNORMAL HIGH (ref 4.0–10.5)
nRBC: 0 % (ref 0.0–0.2)

## 2023-02-11 LAB — PROTIME-INR
INR: 3.4 — ABNORMAL HIGH (ref 0.8–1.2)
Prothrombin Time: 34.3 seconds — ABNORMAL HIGH (ref 11.4–15.2)

## 2023-02-11 MED ORDER — CEPHALEXIN 500 MG PO CAPS
1000.0000 mg | ORAL_CAPSULE | Freq: Two times a day (BID) | ORAL | 0 refills | Status: DC
  Filled 2023-02-11: qty 28, 7d supply, fill #0

## 2023-02-11 MED ORDER — PREDNISONE 20 MG PO TABS: 40.0000 mg | ORAL_TABLET | Freq: Once | ORAL | Status: DC

## 2023-02-11 MED ORDER — PREDNISONE 20 MG PO TABS
40.0000 mg | ORAL_TABLET | Freq: Every day | ORAL | 0 refills | Status: DC
  Filled 2023-02-11: qty 54, 27d supply, fill #0

## 2023-02-11 NOTE — Discharge Instructions (Addendum)
He should try and stop drinking if he can.  Please follow-up with gastroenterology in the office.  I discussed your case with our gastroenterologist.  She thinks you could likely be seen quicker in the office with your established GI doc.  Please feel free to call both offices to see you can see you soonest.  Please return if you experience dark stool or blood in the stool or abdominal pain or fever.

## 2023-02-11 NOTE — ED Provider Notes (Signed)
[place] EMERGENCY DEPARTMENT AT [facility] Provider Note   CSN: [id number] Arrival date & time: 02/11/23  0800     History  Chief Complaint  Patient presents with   Hematuria   Leg Pain    Charles Stone is a 57 y.o. male.  57 yo M with a chief complaints of left leg pain and swelling and dark urine.  Has had the pain and swelling to the leg for about 4 to 5 days.  He thinks he bumped it against something.  Has been getting progressively worse.  He is also been more easily fatigued and had some abdominal distention.  Has a history of alcoholic cirrhosis.  Has cut back on his drinking recently.  No abdominal pain no fevers.   Hematuria  Leg Pain      Home Medications Prior to Admission medications   Medication Sig Start Date End Date Taking? Authorizing Provider  cephALEXin (KEFLEX) 500 MG capsule 2 caps po bid x 7 days 02/11/23  Yes Melene Plan, DO  cetirizine (ZYRTEC) 10 MG tablet Take 10 mg by mouth daily.    [provider]  furosemide (LASIX) 20 MG tablet Take 1 tablet (20 mg total) by mouth daily. 01/02/23   Little Ishikawa, MD  magnesium oxide (MAG-OX) 400 MG tablet Take 1 tablet (400 mg total) by mouth daily. 09/06/22   Little Ishikawa, MD  metoprolol tartrate (LOPRESSOR) 100 MG tablet Take 1 tablet (100mg ) TWO hours prior to CT scan Patient not taking: Reported on 12/26/2022 09/05/22   Little Ishikawa, MD  PROAIR HFA 108 (90 BASE) MCG/ACT inhaler Inhale 1 puff into the lungs every 6 (six) hours as needed for wheezing or shortness of breath. 07/27/13   [provider]  spironolactone (ALDACTONE) 50 MG tablet Take 1 tablet (50 mg total) by mouth daily. 01/02/23   Little Ishikawa, MD      Allergies    Patient has no known allergies.    Review of Systems   Review of Systems  Genitourinary:  Positive for hematuria.    Physical Exam Updated Vital Signs BP (!) 115/57   Pulse 91   Temp 98.5 F (36.9 C) (Oral)    Resp 18   Ht 5\' 9"  (1.753 m)   Wt 94.3 kg   SpO2 97%   BMI 30.72 kg/m  Physical Exam Vitals and nursing note reviewed.  Constitutional:      Appearance: He is well-developed.     Comments: Jaundiced  HENT:     Head: Normocephalic and atraumatic.  Eyes:     Pupils: Pupils are equal, round, and reactive to light.  Neck:     Vascular: No JVD.  Cardiovascular:     Rate and Rhythm: Normal rate and regular rhythm.     Heart sounds: No murmur heard.    No friction rub. No gallop.  Pulmonary:     Effort: No respiratory distress.     Breath sounds: No wheezing.  Abdominal:     General: There is distension.     Tenderness: There is no abdominal tenderness. There is no guarding or rebound.     Comments: Distended abdomen with positive fluid wave  Musculoskeletal:        General: Normal range of motion.     Cervical back: Normal range of motion and neck supple.     Right lower leg: Edema present.     Left lower leg: Edema present.     Comments:  Anasarca but has left lower extremity swelling greater than right.  There are some mild erythema over the medial tibia.  No obvious induration or fluctuance.  Intact pulses bilaterally.  Skin:    Coloration: Skin is not pale.     Findings: No rash.  Neurological:     Mental Status: He is alert and oriented to person, place, and time.  Psychiatric:        Behavior: Behavior normal.     ED Results / Procedures / Treatments   Labs (all labs ordered are listed, but only abnormal results are displayed) Labs Reviewed  URINALYSIS, ROUTINE W REFLEX MICROSCOPIC - Abnormal; Notable for the following components:      Result Value   Hgb urine dipstick MODERATE (*)    Bilirubin Urine MODERATE (*)    All other components within normal limits  CBC WITH DIFFERENTIAL/PLATELET - Abnormal; Notable for the following components:   WBC 11.8 (*)    RBC 1.71 (*)    Hemoglobin 7.1 (*)    HCT 21.1 (*)    MCV 123.4 (*)    MCH 41.5 (*)    Platelets 76 (*)     Neutro Abs 9.1 (*)    Monocytes Absolute 1.5 (*)    All other components within normal limits  COMPREHENSIVE METABOLIC PANEL - Abnormal; Notable for the following components:   Sodium 129 (*)    Chloride 96 (*)    Glucose, Bld 124 (*)    BUN 28 (*)    Creatinine, Ser 1.63 (*)    Albumin 2.7 (*)    AST 44 (*)    Total Bilirubin 20.0 (*)    GFR, Estimated 48 (*)    All other components within normal limits  PROTIME-INR - Abnormal; Notable for the following components:   Prothrombin Time 34.3 (*)    INR 3.4 (*)    All other components within normal limits    EKG None  Radiology US Abdomen Limited RUQ (LIVER/GB)  Result Date: 02/11/2023 CLINICAL DATA:  Right upper quadrant pain and elevated LFTs. EXAM: ULTRASOUND ABDOMEN LIMITED RIGHT UPPER QUADRANT COMPARISON:  05/01/2018 FINDINGS: Gallbladder: No gallstones or wall thickening visualized. No sonographic Murphy sign noted by sonographer. Common bile duct: Diameter: 4-5 mm Liver: Increased echogenicity with irregular contour, features suggesting cirrhosis. Apparent reversal of flow in the main portal vein suggests portal venous hypertension although the portal vein is patent. Other: Moderate volume ascites with bilateral pleural effusions. IMPRESSION: 1. Cirrhotic liver morphology with moderate volume ascites and bilateral pleural effusions. 2. Apparent reversal of flow in the main portal vein suggests portal venous hypertension. Electronically Signed   By: Kennith Center M.D.   On: 02/11/2023 12:46   US Venous Img Lower Unilateral Left  Result Date: 02/11/2023 CLINICAL DATA:  Left lower extremity pain and edema. EXAM: LEFT LOWER EXTREMITY VENOUS DOPPLER ULTRASOUND TECHNIQUE: Gray-scale sonography with graded compression, as well as color Doppler and duplex ultrasound were performed to evaluate the lower extremity deep venous systems from the level of the common femoral vein and including the common femoral, femoral, profunda femoral,  popliteal and calf veins including the posterior tibial, peroneal and gastrocnemius veins when visible. The superficial great saphenous vein was also interrogated. Spectral Doppler was utilized to evaluate flow at rest and with distal augmentation maneuvers in the common femoral, femoral and popliteal veins. COMPARISON:  None Available. FINDINGS: Contralateral Common Femoral Vein: Respiratory phasicity is normal and symmetric with the symptomatic side. No evidence of thrombus. Normal  compressibility. Common Femoral Vein: No evidence of thrombus. Normal compressibility, respiratory phasicity and response to augmentation. Saphenofemoral Junction: No evidence of thrombus. Normal compressibility and flow on color Doppler imaging. Profunda Femoral Vein: No evidence of thrombus. Normal compressibility and flow on color Doppler imaging. Femoral Vein: No evidence of thrombus. Normal compressibility, respiratory phasicity and response to augmentation. Popliteal Vein: No evidence of thrombus. Normal compressibility, respiratory phasicity and response to augmentation. Calf Veins: No evidence of thrombus. Normal compressibility and flow on color Doppler imaging. Superficial Great Saphenous Vein: No evidence of thrombus. Normal compressibility. Venous Reflux:  None. Other Findings: No evidence of superficial thrombophlebitis or abnormal fluid collection. IMPRESSION: No evidence of left lower extremity deep venous thrombosis. Electronically Signed   By: Irish Lack M.D.   On: 02/11/2023 09:01    Procedures Procedures    Medications Ordered in ED Medications - No data to display  ED Course/ Medical Decision Making/ A&P                             Medical Decision Making Amount and/or Complexity of Data Reviewed Labs: ordered. Radiology: ordered.  Risk Prescription drug management.   57 yo M with a chief complaints of left lower extremity swelling and dark urine.  Suspect the patient's urine is dark due to  his hyperbilirubinemia.  Will obtain a UA.  Blood work.  On my record review patient does have a history of alcoholic cirrhosis.  Has seen GI in another health system.    He has anasarca but his left lower extremity is more swollen than the right.  Reportedly was traumatic.  No obvious fracture on exam.  DVT study is negative.  Will treat for possible cellulitis.  Patient has a significant elevation from his INR from prior.  Also has a elevated total bilirubin without other LFT elevation.  AKI.  Hemoglobin dropped from last check as well.  I discussed case with Dr. Lorenso Quarry, gastroenterology.  Recommended right upper quadrant ultrasound.  Recommended more urgent follow-up in the clinic.  Recommended holding off on steroids with his acute kidney injury.  Ultrasound without obstructive process.  Encouraged him to follow-up with his gastroenterologist.  1:15 PM:  I have discussed the diagnosis/risks/treatment options with the patient and family.  Evaluation and diagnostic testing in the emergency department does not suggest an emergent condition requiring admission or immediate intervention beyond what has been performed at this time.  They will follow up with GI. We also discussed returning to the ED immediately if new or worsening sx occur. We discussed the sx which are most concerning (e.g., sudden worsening pain, fever, inability to tolerate by mouth) that necessitate immediate return. Medications administered to the patient during their visit and any new prescriptions provided to the patient are listed below.  Medications given during this visit Medications - No data to display   The patient appears reasonably screen and/or stabilized for discharge and I doubt any other medical condition or other [facility] requiring further screening, evaluation, or treatment in the ED at this time prior to discharge.            Final Clinical Impression(s) / ED Diagnoses Final diagnoses:  Alcoholic  cirrhosis of liver with ascites (HCC)  Pain and swelling of left lower leg    Rx / DC Orders ED Discharge Orders          Ordered    cephALEXin (KEFLEX) 500 MG capsule  02/11/23 1254    predniSONE (DELTASONE) 20 MG tablet  Daily,   Status:  Discontinued        02/11/23 1312              Melene Plan, DO 02/11/23 1315

## 2023-02-11 NOTE — ED Triage Notes (Signed)
Pt arrive  to ED with c/o left leg pain and  left leg swelling x2 days and hematuria.

## 2023-05-06 ENCOUNTER — Inpatient Hospital Stay (HOSPITAL_COMMUNITY)
Admission: EM | Admit: 2023-05-06 | Discharge: 2023-05-30 | DRG: 441 | Disposition: E | Payer: BC Managed Care – PPO | Attending: Internal Medicine | Admitting: Internal Medicine

## 2023-05-06 ENCOUNTER — Other Ambulatory Visit: Payer: Self-pay

## 2023-05-06 ENCOUNTER — Encounter (HOSPITAL_COMMUNITY): Payer: Self-pay

## 2023-05-06 ENCOUNTER — Observation Stay (HOSPITAL_COMMUNITY): Payer: BC Managed Care – PPO

## 2023-05-06 DIAGNOSIS — E861 Hypovolemia: Secondary | ICD-10-CM | POA: Diagnosis present

## 2023-05-06 DIAGNOSIS — Z515 Encounter for palliative care: Secondary | ICD-10-CM

## 2023-05-06 DIAGNOSIS — D539 Nutritional anemia, unspecified: Secondary | ICD-10-CM | POA: Diagnosis present

## 2023-05-06 DIAGNOSIS — E871 Hypo-osmolality and hyponatremia: Secondary | ICD-10-CM | POA: Diagnosis present

## 2023-05-06 DIAGNOSIS — R059 Cough, unspecified: Secondary | ICD-10-CM | POA: Diagnosis present

## 2023-05-06 DIAGNOSIS — R5381 Other malaise: Secondary | ICD-10-CM | POA: Diagnosis present

## 2023-05-06 DIAGNOSIS — D6959 Other secondary thrombocytopenia: Secondary | ICD-10-CM | POA: Diagnosis present

## 2023-05-06 DIAGNOSIS — K7031 Alcoholic cirrhosis of liver with ascites: Secondary | ICD-10-CM | POA: Diagnosis present

## 2023-05-06 DIAGNOSIS — Z66 Do not resuscitate: Secondary | ICD-10-CM | POA: Diagnosis not present

## 2023-05-06 DIAGNOSIS — Z9889 Other specified postprocedural states: Secondary | ICD-10-CM

## 2023-05-06 DIAGNOSIS — R680 Hypothermia, not associated with low environmental temperature: Secondary | ICD-10-CM | POA: Diagnosis present

## 2023-05-06 DIAGNOSIS — I868 Varicose veins of other specified sites: Secondary | ICD-10-CM | POA: Diagnosis present

## 2023-05-06 DIAGNOSIS — K767 Hepatorenal syndrome: Secondary | ICD-10-CM | POA: Diagnosis present

## 2023-05-06 DIAGNOSIS — Z79899 Other long term (current) drug therapy: Secondary | ICD-10-CM

## 2023-05-06 DIAGNOSIS — K7682 Hepatic encephalopathy: Secondary | ICD-10-CM | POA: Diagnosis not present

## 2023-05-06 DIAGNOSIS — J90 Pleural effusion, not elsewhere classified: Secondary | ICD-10-CM | POA: Diagnosis present

## 2023-05-06 DIAGNOSIS — J9811 Atelectasis: Secondary | ICD-10-CM | POA: Diagnosis present

## 2023-05-06 DIAGNOSIS — F1021 Alcohol dependence, in remission: Secondary | ICD-10-CM | POA: Diagnosis present

## 2023-05-06 DIAGNOSIS — E860 Dehydration: Secondary | ICD-10-CM | POA: Diagnosis present

## 2023-05-06 DIAGNOSIS — J9601 Acute respiratory failure with hypoxia: Secondary | ICD-10-CM | POA: Diagnosis not present

## 2023-05-06 DIAGNOSIS — G928 Other toxic encephalopathy: Secondary | ICD-10-CM | POA: Diagnosis not present

## 2023-05-06 DIAGNOSIS — D649 Anemia, unspecified: Principal | ICD-10-CM | POA: Diagnosis present

## 2023-05-06 DIAGNOSIS — N1831 Chronic kidney disease, stage 3a: Secondary | ICD-10-CM | POA: Diagnosis present

## 2023-05-06 DIAGNOSIS — T508X5A Adverse effect of diagnostic agents, initial encounter: Secondary | ICD-10-CM | POA: Diagnosis not present

## 2023-05-06 DIAGNOSIS — Z781 Physical restraint status: Secondary | ICD-10-CM

## 2023-05-06 DIAGNOSIS — A4151 Sepsis due to Escherichia coli [E. coli]: Secondary | ICD-10-CM | POA: Diagnosis present

## 2023-05-06 DIAGNOSIS — R600 Localized edema: Secondary | ICD-10-CM | POA: Diagnosis present

## 2023-05-06 DIAGNOSIS — D689 Coagulation defect, unspecified: Secondary | ICD-10-CM | POA: Diagnosis present

## 2023-05-06 DIAGNOSIS — D619 Aplastic anemia, unspecified: Secondary | ICD-10-CM | POA: Diagnosis present

## 2023-05-06 DIAGNOSIS — K429 Umbilical hernia without obstruction or gangrene: Secondary | ICD-10-CM | POA: Diagnosis present

## 2023-05-06 DIAGNOSIS — J45909 Unspecified asthma, uncomplicated: Secondary | ICD-10-CM | POA: Diagnosis present

## 2023-05-06 DIAGNOSIS — R601 Generalized edema: Secondary | ICD-10-CM | POA: Diagnosis present

## 2023-05-06 DIAGNOSIS — Z811 Family history of alcohol abuse and dependence: Secondary | ICD-10-CM

## 2023-05-06 DIAGNOSIS — R011 Cardiac murmur, unspecified: Secondary | ICD-10-CM | POA: Diagnosis present

## 2023-05-06 DIAGNOSIS — D7589 Other specified diseases of blood and blood-forming organs: Secondary | ICD-10-CM | POA: Diagnosis present

## 2023-05-06 DIAGNOSIS — Z8249 Family history of ischemic heart disease and other diseases of the circulatory system: Secondary | ICD-10-CM

## 2023-05-06 DIAGNOSIS — I1310 Hypertensive heart and chronic kidney disease without heart failure, with stage 1 through stage 4 chronic kidney disease, or unspecified chronic kidney disease: Secondary | ICD-10-CM | POA: Diagnosis present

## 2023-05-06 DIAGNOSIS — N1411 Contrast-induced nephropathy: Secondary | ICD-10-CM | POA: Diagnosis not present

## 2023-05-06 DIAGNOSIS — K766 Portal hypertension: Secondary | ICD-10-CM | POA: Diagnosis present

## 2023-05-06 DIAGNOSIS — Z809 Family history of malignant neoplasm, unspecified: Secondary | ICD-10-CM

## 2023-05-06 DIAGNOSIS — G9608 Other cranial cerebrospinal fluid leak: Secondary | ICD-10-CM | POA: Diagnosis present

## 2023-05-06 DIAGNOSIS — Z833 Family history of diabetes mellitus: Secondary | ICD-10-CM

## 2023-05-06 DIAGNOSIS — R6521 Severe sepsis with septic shock: Secondary | ICD-10-CM | POA: Diagnosis present

## 2023-05-06 DIAGNOSIS — R531 Weakness: Secondary | ICD-10-CM | POA: Diagnosis present

## 2023-05-06 DIAGNOSIS — N178 Other acute kidney failure: Secondary | ICD-10-CM | POA: Diagnosis present

## 2023-05-06 DIAGNOSIS — E872 Acidosis, unspecified: Secondary | ICD-10-CM | POA: Diagnosis not present

## 2023-05-06 DIAGNOSIS — K7040 Alcoholic hepatic failure without coma: Secondary | ICD-10-CM | POA: Diagnosis present

## 2023-05-06 DIAGNOSIS — I8500 Esophageal varices without bleeding: Secondary | ICD-10-CM | POA: Diagnosis present

## 2023-05-06 DIAGNOSIS — I272 Pulmonary hypertension, unspecified: Secondary | ICD-10-CM | POA: Diagnosis present

## 2023-05-06 DIAGNOSIS — R918 Other nonspecific abnormal finding of lung field: Secondary | ICD-10-CM | POA: Diagnosis present

## 2023-05-06 DIAGNOSIS — F05 Delirium due to known physiological condition: Secondary | ICD-10-CM | POA: Diagnosis present

## 2023-05-06 LAB — COMPREHENSIVE METABOLIC PANEL
ALT: 31 U/L (ref 0–44)
AST: 69 U/L — ABNORMAL HIGH (ref 15–41)
Albumin: 1.8 g/dL — ABNORMAL LOW (ref 3.5–5.0)
Alkaline Phosphatase: 115 U/L (ref 38–126)
Anion gap: 9 (ref 5–15)
BUN: 26 mg/dL — ABNORMAL HIGH (ref 6–20)
CO2: 21 mmol/L — ABNORMAL LOW (ref 22–32)
Calcium: 9.1 mg/dL (ref 8.9–10.3)
Chloride: 100 mmol/L (ref 98–111)
Creatinine, Ser: 1.68 mg/dL — ABNORMAL HIGH (ref 0.61–1.24)
GFR, Estimated: 47 mL/min — ABNORMAL LOW (ref >60)
Glucose, Bld: 112 mg/dL — ABNORMAL HIGH (ref 70–99)
Potassium: 4.5 mmol/L (ref 3.5–5.1)
Sodium: 130 mmol/L — ABNORMAL LOW (ref 135–145)
Total Bilirubin: 16 mg/dL — ABNORMAL HIGH (ref 0.3–1.2)
Total Protein: 6.6 g/dL (ref 6.5–8.1)

## 2023-05-06 LAB — AMMONIA: Ammonia: 67 umol/L — ABNORMAL HIGH (ref 9–35)

## 2023-05-06 LAB — CBC
HCT: 20.1 % — ABNORMAL LOW (ref 39.0–52.0)
Hemoglobin: 6.7 g/dL — CL (ref 13.0–17.0)
MCH: 43.5 pg — ABNORMAL HIGH (ref 26.0–34.0)
MCHC: 33.3 g/dL (ref 30.0–36.0)
MCV: 130.5 fL — ABNORMAL HIGH (ref 80.0–100.0)
Platelets: 72 10*3/uL — ABNORMAL LOW (ref 150–400)
RBC: 1.54 MIL/uL — ABNORMAL LOW (ref 4.22–5.81)
RDW: 16.8 % — ABNORMAL HIGH (ref 11.5–15.5)
WBC: 8.6 10*3/uL (ref 4.0–10.5)
nRBC: 0 % (ref 0.0–0.2)

## 2023-05-06 LAB — BRAIN NATRIURETIC PEPTIDE: B Natriuretic Peptide: 197.7 pg/mL — ABNORMAL HIGH (ref 0.0–100.0)

## 2023-05-06 LAB — LIPASE, BLOOD: Lipase: 26 U/L (ref 11–51)

## 2023-05-06 LAB — PREPARE RBC (CROSSMATCH)

## 2023-05-06 MED ORDER — SODIUM CHLORIDE 0.9% FLUSH
3.0000 mL | Freq: Two times a day (BID) | INTRAVENOUS | Status: DC
  Administered 2023-05-06 – 2023-05-10 (×5): 3 mL via INTRAVENOUS

## 2023-05-06 MED ORDER — SPIRONOLACTONE 100 MG PO TABS
100.0000 mg | ORAL_TABLET | Freq: Every day | ORAL | Status: DC
  Administered 2023-05-07: 100 mg via ORAL
  Filled 2023-05-06 (×2): qty 1

## 2023-05-06 MED ORDER — ACETAMINOPHEN 500 MG PO TABS
500.0000 mg | ORAL_TABLET | Freq: Four times a day (QID) | ORAL | Status: DC | PRN
  Administered 2023-05-07 (×2): 500 mg via ORAL
  Filled 2023-05-06 (×2): qty 1

## 2023-05-06 MED ORDER — ALBUTEROL SULFATE (2.5 MG/3ML) 0.083% IN NEBU
3.0000 mL | INHALATION_SOLUTION | RESPIRATORY_TRACT | Status: DC | PRN
  Filled 2023-05-06: qty 3

## 2023-05-06 MED ORDER — ADULT MULTIVITAMIN W/MINERALS CH
1.0000 | ORAL_TABLET | Freq: Every day | ORAL | Status: DC
  Administered 2023-05-07 (×2): 1 via ORAL
  Filled 2023-05-06 (×2): qty 1

## 2023-05-06 MED ORDER — FOLIC ACID 1 MG PO TABS
1.0000 mg | ORAL_TABLET | Freq: Every day | ORAL | Status: DC
  Administered 2023-05-07 (×2): 1 mg via ORAL
  Filled 2023-05-06 (×2): qty 1

## 2023-05-06 MED ORDER — FUROSEMIDE 10 MG/ML IJ SOLN
20.0000 mg | Freq: Once | INTRAMUSCULAR | Status: AC
  Administered 2023-05-07: 20 mg via INTRAVENOUS
  Filled 2023-05-06: qty 2

## 2023-05-06 MED ORDER — THIAMINE MONONITRATE 100 MG PO TABS
100.0000 mg | ORAL_TABLET | Freq: Every day | ORAL | Status: DC
  Administered 2023-05-07 (×2): 100 mg via ORAL
  Filled 2023-05-06 (×2): qty 1

## 2023-05-06 MED ORDER — LACTULOSE 10 GM/15ML PO SOLN
20.0000 g | Freq: Three times a day (TID) | ORAL | Status: DC
  Administered 2023-05-07 (×4): 20 g via ORAL
  Filled 2023-05-06 (×4): qty 30

## 2023-05-06 MED ORDER — SODIUM CHLORIDE 0.9% IV SOLUTION: Freq: Once | INTRAVENOUS | Status: AC

## 2023-05-06 NOTE — ED Notes (Signed)
BLOOD CONSENT SIGNED. 

## 2023-05-06 NOTE — ED Triage Notes (Signed)
Patient with hx of cirrhosis arrives with family for eval of AMS and lethargy since Saturday. Also had labs drawn on 10/3, showing multiple abnormal results.

## 2023-05-06 NOTE — ED Provider Notes (Signed)
[place] EMERGENCY DEPARTMENT AT [facility] Provider Note   CSN: [id number] Arrival date & time: 05/06/23  1457     History  Chief Complaint  Patient presents with   Altered Mental Status   Abnormal Lab    MAIKOL GRASSIA is a 57 y.o. male.  HPI Patient presents with his sister who assists with the history. Patient notes a history of alcohol abuse, last consumed 5 weeks ago.  He has known cirrhosis.  He presents today with weakness, fatigue, and his sister notes waxing, waning mental status though the patient is unsure of this. He denies any hematuria, hematemesis or melena. However, with worsening fatigue he presents for evaluation.    Home Medications Prior to Admission medications   Medication Sig Start Date End Date Taking? Authorizing Provider  cephALEXin (KEFLEX) 500 MG capsule Take 2 capsules (1,000 mg total) by mouth 2 (two) times daily for 7 days 02/11/23   Melene Plan, DO  cetirizine (ZYRTEC) 10 MG tablet Take 10 mg by mouth daily.    [provider]  furosemide (LASIX) 20 MG tablet Take 1 tablet (20 mg total) by mouth daily. 01/02/23   Little Ishikawa, MD  magnesium oxide (MAG-OX) 400 MG tablet Take 1 tablet (400 mg total) by mouth daily. 09/06/22   Little Ishikawa, MD  metoprolol tartrate (LOPRESSOR) 100 MG tablet Take 1 tablet (100mg ) TWO hours prior to CT scan Patient not taking: Reported on 12/26/2022 09/05/22   Little Ishikawa, MD  PROAIR HFA 108 (90 BASE) MCG/ACT inhaler Inhale 1 puff into the lungs every 6 (six) hours as needed for wheezing or shortness of breath. 07/27/13   [provider]  spironolactone (ALDACTONE) 50 MG tablet Take 1 tablet (50 mg total) by mouth daily. 01/02/23   Little Ishikawa, MD      Allergies    Patient has no known allergies.    Review of Systems   Review of Systems  All other systems reviewed and are negative.   Physical Exam Updated Vital Signs BP (!) 136/54   Pulse 95    Temp 97.9 F (36.6 C)   Resp 17   SpO2 100%  Physical Exam Vitals and nursing note reviewed.  Constitutional:      General: He is not in acute distress.    Appearance: He is well-developed.  HENT:     Head: Normocephalic and atraumatic.  Eyes:     Conjunctiva/sclera: Conjunctivae normal.  Cardiovascular:     Rate and Rhythm: Normal rate and regular rhythm.  Pulmonary:     Effort: Pulmonary effort is normal. No respiratory distress.     Breath sounds: No stridor.  Abdominal:     General: There is no distension.     Tenderness: There is no abdominal tenderness. There is no guarding.     Comments: Protuberant but not tender abdomen.  Skin:    General: Skin is warm and dry.     Coloration: Skin is jaundiced.  Neurological:     Mental Status: He is alert and oriented to person, place, and time.     ED Results / Procedures / Treatments   Labs (all labs ordered are listed, but only abnormal results are displayed) Labs Reviewed  COMPREHENSIVE METABOLIC PANEL - Abnormal; Notable for the following components:      Result Value   Sodium 130 (*)    CO2 21 (*)    Glucose, Bld 112 (*)    BUN 26 (*)  Creatinine, Ser 1.68 (*)    Albumin 1.8 (*)    AST 69 (*)    Total Bilirubin 16.0 (*)    GFR, Estimated 47 (*)    All other components within normal limits  CBC - Abnormal; Notable for the following components:   RBC 1.54 (*)    Hemoglobin 6.7 (*)    HCT 20.1 (*)    MCV 130.5 (*)    MCH 43.5 (*)    RDW 16.8 (*)    Platelets 72 (*)    All other components within normal limits  LIPASE, BLOOD  AMMONIA  BRAIN NATRIURETIC PEPTIDE  TYPE AND SCREEN    EKG None  Radiology No results found.  Procedures Procedures    Medications Ordered in ED Medications - No data to display  ED Course/ Medical Decision Making/ A&P                                 Medical Decision Making Adult male with history of alcohol abuse, known cirrhosis presents with weakness, fatigue,  questionable mental status changes.  Differential includes worsening hepatic or hepatorenal dysfunction, infection, dehydration, hyperammonemia with encephalopathy, GI bleed.  Patient placed on monitors, initial labs from triage were reviewed with the patient and family members.  Patient with persistent renal dysfunction creatinine normal 1.7, persistent hepatic dysfunction with bilirubin 16, and critical hemoglobin abnormality 6.7. Ammonia level added, patient had a type and screen, discussion of transfusion, received initiation of transfusion in the ED was admitted for further monitoring, management.  Amount and/or Complexity of Data Reviewed Independent Historian:     Details: Sister at bedside External Data Reviewed: notes. Labs: ordered. Decision-making details documented in ED Course.  Risk Prescription drug management. Decision regarding hospitalization. Diagnosis or treatment significantly limited by social determinants of health.  Critical Care Total time providing critical care: 40 minutes   Labs concerning for critical anemia, and as above abnormal renal and hepatic function.  Patient had type screen, initiation of transfusion, required admission for symptomatic anemia in the context of known cirrhosis.   Final Clinical Impression(s) / ED Diagnoses Final diagnoses:  Symptomatic anemia  Alcoholic cirrhosis of liver with ascites (HCC)     Gerhard Munch, MD 05/06/23 2027

## 2023-05-07 ENCOUNTER — Encounter (HOSPITAL_COMMUNITY): Payer: Self-pay | Admitting: Internal Medicine

## 2023-05-07 ENCOUNTER — Observation Stay (HOSPITAL_COMMUNITY): Payer: BC Managed Care – PPO

## 2023-05-07 DIAGNOSIS — D539 Nutritional anemia, unspecified: Secondary | ICD-10-CM | POA: Diagnosis present

## 2023-05-07 DIAGNOSIS — K7682 Hepatic encephalopathy: Secondary | ICD-10-CM | POA: Diagnosis present

## 2023-05-07 DIAGNOSIS — E872 Acidosis, unspecified: Secondary | ICD-10-CM | POA: Diagnosis not present

## 2023-05-07 DIAGNOSIS — J9601 Acute respiratory failure with hypoxia: Secondary | ICD-10-CM | POA: Diagnosis not present

## 2023-05-07 DIAGNOSIS — F05 Delirium due to known physiological condition: Secondary | ICD-10-CM | POA: Diagnosis present

## 2023-05-07 DIAGNOSIS — K7211 Chronic hepatic failure with coma: Secondary | ICD-10-CM | POA: Diagnosis not present

## 2023-05-07 DIAGNOSIS — K7031 Alcoholic cirrhosis of liver with ascites: Secondary | ICD-10-CM | POA: Diagnosis present

## 2023-05-07 DIAGNOSIS — F1021 Alcohol dependence, in remission: Secondary | ICD-10-CM | POA: Diagnosis present

## 2023-05-07 DIAGNOSIS — D649 Anemia, unspecified: Secondary | ICD-10-CM

## 2023-05-07 DIAGNOSIS — R6521 Severe sepsis with septic shock: Secondary | ICD-10-CM | POA: Diagnosis present

## 2023-05-07 DIAGNOSIS — K7040 Alcoholic hepatic failure without coma: Secondary | ICD-10-CM | POA: Diagnosis present

## 2023-05-07 DIAGNOSIS — E871 Hypo-osmolality and hyponatremia: Secondary | ICD-10-CM | POA: Diagnosis present

## 2023-05-07 DIAGNOSIS — J45909 Unspecified asthma, uncomplicated: Secondary | ICD-10-CM | POA: Diagnosis present

## 2023-05-07 DIAGNOSIS — A4151 Sepsis due to Escherichia coli [E. coli]: Secondary | ICD-10-CM | POA: Diagnosis present

## 2023-05-07 DIAGNOSIS — I1310 Hypertensive heart and chronic kidney disease without heart failure, with stage 1 through stage 4 chronic kidney disease, or unspecified chronic kidney disease: Secondary | ICD-10-CM | POA: Diagnosis present

## 2023-05-07 DIAGNOSIS — G9608 Other cranial cerebrospinal fluid leak: Secondary | ICD-10-CM | POA: Diagnosis present

## 2023-05-07 DIAGNOSIS — K7290 Hepatic failure, unspecified without coma: Secondary | ICD-10-CM | POA: Diagnosis not present

## 2023-05-07 DIAGNOSIS — R079 Chest pain, unspecified: Secondary | ICD-10-CM

## 2023-05-07 DIAGNOSIS — J90 Pleural effusion, not elsewhere classified: Secondary | ICD-10-CM | POA: Diagnosis present

## 2023-05-07 DIAGNOSIS — K652 Spontaneous bacterial peritonitis: Secondary | ICD-10-CM | POA: Diagnosis not present

## 2023-05-07 DIAGNOSIS — Z66 Do not resuscitate: Secondary | ICD-10-CM | POA: Diagnosis not present

## 2023-05-07 DIAGNOSIS — K767 Hepatorenal syndrome: Secondary | ICD-10-CM | POA: Diagnosis present

## 2023-05-07 DIAGNOSIS — I272 Pulmonary hypertension, unspecified: Secondary | ICD-10-CM | POA: Diagnosis present

## 2023-05-07 DIAGNOSIS — G928 Other toxic encephalopathy: Secondary | ICD-10-CM | POA: Diagnosis not present

## 2023-05-07 DIAGNOSIS — D689 Coagulation defect, unspecified: Secondary | ICD-10-CM | POA: Diagnosis not present

## 2023-05-07 DIAGNOSIS — Z515 Encounter for palliative care: Secondary | ICD-10-CM | POA: Diagnosis not present

## 2023-05-07 DIAGNOSIS — D619 Aplastic anemia, unspecified: Secondary | ICD-10-CM | POA: Diagnosis present

## 2023-05-07 DIAGNOSIS — R7881 Bacteremia: Secondary | ICD-10-CM | POA: Diagnosis not present

## 2023-05-07 DIAGNOSIS — K766 Portal hypertension: Secondary | ICD-10-CM | POA: Diagnosis present

## 2023-05-07 DIAGNOSIS — N1831 Chronic kidney disease, stage 3a: Secondary | ICD-10-CM | POA: Diagnosis present

## 2023-05-07 DIAGNOSIS — J9811 Atelectasis: Secondary | ICD-10-CM | POA: Diagnosis present

## 2023-05-07 DIAGNOSIS — I8500 Esophageal varices without bleeding: Secondary | ICD-10-CM | POA: Diagnosis present

## 2023-05-07 LAB — URINALYSIS, ROUTINE W REFLEX MICROSCOPIC
Bacteria, UA: NONE SEEN
Glucose, UA: NEGATIVE mg/dL
Ketones, ur: NEGATIVE mg/dL
Leukocytes,Ua: NEGATIVE
Nitrite: NEGATIVE
Protein, ur: NEGATIVE mg/dL
Specific Gravity, Urine: 1.016 (ref 1.005–1.030)
pH: 6 (ref 5.0–8.0)

## 2023-05-07 LAB — DIFFERENTIAL
Abs Immature Granulocytes: 0.06 10*3/uL (ref 0.00–0.07)
Basophils Absolute: 0.1 10*3/uL (ref 0.0–0.1)
Basophils Relative: 1 %
Eosinophils Absolute: 0.3 10*3/uL (ref 0.0–0.5)
Eosinophils Relative: 3 %
Immature Granulocytes: 1 %
Lymphocytes Relative: 11 %
Lymphs Abs: 0.9 10*3/uL (ref 0.7–4.0)
Monocytes Absolute: 1.1 10*3/uL — ABNORMAL HIGH (ref 0.1–1.0)
Monocytes Relative: 13 %
Neutro Abs: 6.4 10*3/uL (ref 1.7–7.7)
Neutrophils Relative %: 71 %

## 2023-05-07 LAB — HEPATIC FUNCTION PANEL
ALT: 31 U/L (ref 0–44)
AST: 58 U/L — ABNORMAL HIGH (ref 15–41)
Albumin: 1.7 g/dL — ABNORMAL LOW (ref 3.5–5.0)
Alkaline Phosphatase: 133 U/L — ABNORMAL HIGH (ref 38–126)
Bilirubin, Direct: 6.4 mg/dL — ABNORMAL HIGH (ref 0.0–0.2)
Indirect Bilirubin: 8.9 mg/dL — ABNORMAL HIGH (ref 0.3–0.9)
Total Bilirubin: 15.3 mg/dL — ABNORMAL HIGH (ref 0.3–1.2)
Total Protein: 6.5 g/dL (ref 6.5–8.1)

## 2023-05-07 LAB — CBC
HCT: 21.4 % — ABNORMAL LOW (ref 39.0–52.0)
Hemoglobin: 7.2 g/dL — ABNORMAL LOW (ref 13.0–17.0)
MCH: 42.1 pg — ABNORMAL HIGH (ref 26.0–34.0)
MCHC: 33.6 g/dL (ref 30.0–36.0)
MCV: 125.1 fL — ABNORMAL HIGH (ref 80.0–100.0)
Platelets: 71 10*3/uL — ABNORMAL LOW (ref 150–400)
RBC: 1.71 MIL/uL — ABNORMAL LOW (ref 4.22–5.81)
RDW: 20.9 % — ABNORMAL HIGH (ref 11.5–15.5)
WBC: 8.8 10*3/uL (ref 4.0–10.5)
nRBC: 0 % (ref 0.0–0.2)

## 2023-05-07 LAB — IRON AND TIBC
Iron: 196 ug/dL — ABNORMAL HIGH (ref 45–182)
Saturation Ratios: 84 % — ABNORMAL HIGH (ref 17.9–39.5)
TIBC: 232 ug/dL — ABNORMAL LOW (ref 250–450)
UIBC: 36 ug/dL

## 2023-05-07 LAB — TECHNOLOGIST SMEAR REVIEW: Plt Morphology: DECREASED

## 2023-05-07 LAB — RETICULOCYTES
Immature Retic Fract: 25.4 % — ABNORMAL HIGH (ref 2.3–15.9)
RBC.: 1.71 MIL/uL — ABNORMAL LOW (ref 4.22–5.81)
Retic Count, Absolute: 171 10*3/uL (ref 19.0–186.0)
Retic Ct Pct: 10 % — ABNORMAL HIGH (ref 0.4–3.1)

## 2023-05-07 LAB — BASIC METABOLIC PANEL
Anion gap: 8 (ref 5–15)
BUN: 27 mg/dL — ABNORMAL HIGH (ref 6–20)
CO2: 23 mmol/L (ref 22–32)
Calcium: 9.3 mg/dL (ref 8.9–10.3)
Chloride: 101 mmol/L (ref 98–111)
Creatinine, Ser: 1.59 mg/dL — ABNORMAL HIGH (ref 0.61–1.24)
GFR, Estimated: 50 mL/min — ABNORMAL LOW (ref >60)
Glucose, Bld: 115 mg/dL — ABNORMAL HIGH (ref 70–99)
Potassium: 4.4 mmol/L (ref 3.5–5.1)
Sodium: 132 mmol/L — ABNORMAL LOW (ref 135–145)

## 2023-05-07 LAB — ECHOCARDIOGRAM COMPLETE
AR max vel: 2.9 cm2
AV Area VTI: 3.06 cm2
AV Area mean vel: 2.73 cm2
AV Mean grad: 12 mm[Hg]
AV Peak grad: 20.6 mm[Hg]
Ao pk vel: 2.27 m/s
Area-P 1/2: 4.15 cm2
S' Lateral: 3.8 cm

## 2023-05-07 LAB — FERRITIN: Ferritin: 609 ng/mL — ABNORMAL HIGH (ref 24–336)

## 2023-05-07 LAB — PROTEIN / CREATININE RATIO, URINE
Creatinine, Urine: 136 mg/dL
Protein Creatinine Ratio: 0.06 mg/mg{creat} (ref 0.00–0.15)
Total Protein, Urine: 8 mg/dL

## 2023-05-07 LAB — TRANSFERRIN: Transferrin: 161 mg/dL — ABNORMAL LOW (ref 180–329)

## 2023-05-07 LAB — PHOSPHORUS: Phosphorus: 3.5 mg/dL (ref 2.5–4.6)

## 2023-05-07 LAB — VITAMIN B12: Vitamin B-12: >7500 pg/mL — ABNORMAL HIGH (ref 180–914)

## 2023-05-07 LAB — PROTIME-INR
INR: 3.2 — ABNORMAL HIGH (ref 0.8–1.2)
Prothrombin Time: 32.7 s — ABNORMAL HIGH (ref 11.4–15.2)

## 2023-05-07 LAB — FOLATE: Folate: 21.2 ng/mL (ref >5.9)

## 2023-05-07 LAB — TSH: TSH: 2.227 u[IU]/mL (ref 0.350–4.500)

## 2023-05-07 LAB — MAGNESIUM: Magnesium: 1.5 mg/dL — ABNORMAL LOW (ref 1.7–2.4)

## 2023-05-07 LAB — HIV ANTIBODY (ROUTINE TESTING W REFLEX): HIV Screen 4th Generation wRfx: NONREACTIVE

## 2023-05-07 MED ORDER — DOXYLAMINE SUCCINATE (SLEEP) 25 MG PO TABS
25.0000 mg | ORAL_TABLET | Freq: Every evening | ORAL | Status: DC | PRN
  Filled 2023-05-07 (×3): qty 1

## 2023-05-07 MED ORDER — ONDANSETRON HCL 4 MG/2ML IJ SOLN
4.0000 mg | Freq: Four times a day (QID) | INTRAMUSCULAR | Status: DC | PRN
  Administered 2023-05-07 (×2): 4 mg via INTRAVENOUS
  Filled 2023-05-07 (×2): qty 2

## 2023-05-07 NOTE — Evaluation (Signed)
Physical Therapy Evaluation Patient Details Name: Charles Stone MRN: [id number] DOB: 08/20/1965 Today's Date: 05/07/2023  History of Present Illness  Pt presented to the ED 10/8 with c/o fatigue and confusion.Hgb 6.7.  Admitted for symptomatic anemia. He recevied 1 unit PRBC in ED. PMH:  hx of decompensated cirrhosis, hx alcohol use disorder in early remission, CKD stage III, anemia, thrombocytopenia    Clinical Impression  Pt admitted with above diagnosis. PTA pt lived at home with his girlfriend, independent, driving and employed. Pt currently with functional limitations due to the deficits listed below (see PT Problem List). On eval, pt mod I bed mobility, supervision transfers, and supervision in room ambulation without AD. Mobility very limited by nausea. Suspect he will amb without difficulty household distances when nausea resolves. BP 132/59 after mobility. Pt will benefit from acute skilled PT to increase their independence and safety with mobility to allow discharge. PT to follow acutely. No follow up PT services indicated. No DME needs.          If plan is discharge home, recommend the following: Assistance with cooking/housework;Assist for transportation;Help with stairs or ramp for entrance   Can travel by private vehicle        Equipment Recommendations None recommended by PT  Recommendations for Other Services       Functional Status Assessment Patient has had a recent decline in their functional status and demonstrates the ability to make significant improvements in function in a reasonable and predictable amount of time.     Precautions / Restrictions Precautions Precautions: Other (comment) Precaution Comments: watch BP      Mobility  Bed Mobility Overal bed mobility: Modified Independent                  Transfers Overall transfer level: Needs assistance Equipment used: None Transfers: Sit to/from Stand Sit to Stand: Supervision            General transfer comment: no physical assist    Ambulation/Gait Ambulation/Gait assistance: Supervision   Assistive device: None Gait Pattern/deviations: Step-through pattern       General Gait Details: amb back and forth severl steps along side of stretcher. Declining farther distance due to nausea.  Stairs            Wheelchair Mobility     Tilt Bed    Modified Rankin (Stroke Patients Only)       Balance Overall balance assessment: No apparent balance deficits (not formally assessed)                                           Pertinent Vitals/Pain Pain Assessment Pain Assessment: No/denies pain    Home Living Family/patient expects to be discharged to:: Private residence Living Arrangements: Spouse/significant other Available Help at Discharge: Family;Available PRN/intermittently Type of Home: House Home Access: Level entry       Home Layout: One level Home Equipment: None      Prior Function Prior Level of Function : Independent/Modified Independent;Working/employed;Driving                     Extremity/Trunk Assessment   Upper Extremity Assessment Upper Extremity Assessment: Overall WFL for tasks assessed    Lower Extremity Assessment Lower Extremity Assessment: Overall WFL for tasks assessed    Cervical / Trunk Assessment Cervical / Trunk Assessment: Normal  Communication   Communication  Communication: No apparent difficulties  Cognition Arousal: Alert Behavior During Therapy: WFL for tasks assessed/performed Overall Cognitive Status: Within Functional Limits for tasks assessed                                          General Comments General comments (skin integrity, edema, etc.): BP 132/59 after mobility. HR in 110s    Exercises     Assessment/Plan    PT Assessment Patient needs continued PT services  PT Problem List Decreased mobility;Decreased activity tolerance       PT  Treatment Interventions Functional mobility training;Patient/family education;Therapeutic activities;Gait training;Stair training;Therapeutic exercise    PT Goals (Current goals can be found in the Care Plan section)  Acute Rehab PT Goals Patient Stated Goal: feel better PT Goal Formulation: With patient Time For Goal Achievement: 05/21/23 Potential to Achieve Goals: Good    Frequency Min 1X/week     Co-evaluation               AM-PAC PT "6 Clicks" Mobility  Outcome Measure Help needed turning from your back to your side while in a flat bed without using bedrails?: None Help needed moving from lying on your back to sitting on the side of a flat bed without using bedrails?: None Help needed moving to and from a bed to a chair (including a wheelchair)?: A Little Help needed standing up from a chair using your arms (e.g., wheelchair or bedside chair)?: A Little Help needed to walk in hospital room?: A Little Help needed climbing 3-5 steps with a railing? : A Little 6 Click Score: 20    End of Session   Activity Tolerance: Treatment limited secondary to medical complications (Comment) (nausea) Patient left: in bed;with call bell/phone within reach Nurse Communication: Mobility status PT Visit Diagnosis: Difficulty in walking, not elsewhere classified (R26.2)    Time: 0960-4540 PT Time Calculation (min) (ACUTE ONLY): 13 min   Charges:   PT Evaluation $PT Eval Low Complexity: 1 Low   PT General Charges $$ ACUTE PT VISIT: 1 Visit         Ferd Glassing., PT  Office # [phone]   Ilda Foil 05/07/2023, 1:40 PM

## 2023-05-07 NOTE — Plan of Care (Signed)

## 2023-05-07 NOTE — ED Notes (Signed)
Pt ambulated to restroom with moderate assistance.

## 2023-05-07 NOTE — ED Notes (Addendum)
Pt ambulated to restroom with moderate assistance, required help post bowel movement, and patient reports shortness of breath after ambulating. Post ambulation oxygen saturation 98-100% on room air.

## 2023-05-07 NOTE — Progress Notes (Addendum)
Patient seen and evaluated, chart reviewed, please see EMR for updated orders. Please see full H&P dictated by admitting physician  Dr Azell Der same date of service.   Brief Summary:-  57 y.o. male with hx of decompensated alcoholic cirrhosis, hx alcohol use disorder in early remission, CKD stage III, anemia, thrombocytopenia, who presents with 2-week history of fatigue, and recent confusion admitted on 05/07/2023 with acute hepatic encephalopathy  A/p 1) decompensated alcoholic liver cirrhosis with acute hepatic encephalopathy -Serum ammonia 67 -Continue lactulose -Patient usually sees Dr.Karl Pleasant and Dr Concepcion Elk at [facility] -in [place] -He scheduled for EGD for evaluation of varices/portal hypertension--in November 2024 with above GI practitioners --Patient is advised to discuss with his GI team regarding his outpatient use of testosterone -He had BMs with lactulose  -his mentation is improving with lactulose  2) alcohol abuse--patient states last alcoholic intake was last week of August 2024 -Continue multivitamin folic acid and thiamine  3) chronic anemia and thrombocytopenia--due to liver cirrhosis -No acute bleeding concerns at this time -Anemia workup noted -B12, folate, ferritin and iron are not low -Hgb was down to 6.7 patient received 1 unit of PRBC -Hgb is now up to 7.2 - 4) dyspnea and lower extremity edema--admitting provider requested echo -At the time of this dictation echocardiogram has not been interpreted yet -- 5)CKD stage -3A -Function currently close to baseline  -renally adjust medications, avoid nephrotoxic agents / dehydration  / hypotension  6) generalized weakness--PT eval appreciated no PT follow-up advised - -Notable exam findings -Generalized weakness noted -Scleral icterus and jaundice noted 2/6 SM noted -2+ pitting edema of the lower extremities and mild ascites noted -As per significant other at bedside mentation  appears to be improving but not quite back to baseline  Plan of care discussed with patient and his significant other Ms Pollie Meyer , questions answered -Total care time 53 minutes  - Disposition:- Anticipate discharge home with outpatient GI follow-up with GI team after echo report is available probably on 05/08/2023 as long as his mentation continues to improve and Hgb stable  Shon Hale, MD

## 2023-05-07 NOTE — ED Notes (Signed)
ED TO INPATIENT HANDOFF REPORT  ED Nurse Name and Phone #: [phone]  S Name/Age/Gender Charles Stone 57 y.o. male Room/Bed: 002C/002C  Code Status   Code Status: Full Code  Home/SNF/Other Home Patient oriented to: self, place, time, and situation Is this baseline? Yes   Triage Complete: Triage complete  Chief Complaint Symptomatic anemia [D64.9]  Triage Note Patient with hx of cirrhosis arrives with family for eval of AMS and lethargy since Saturday. Also had labs drawn on 10/3, showing multiple abnormal results.       Allergies No Known Allergies  Level of Care/Admitting Diagnosis ED Disposition     ED Disposition  Admit   Condition  --   Comment  Hospital Area: [facility] [[id number]]  Level of Care: Telemetry Medical [104]  May place patient in observation at [facility] or [facility] if equivalent level of care is available:: No  Covid Evaluation: Asymptomatic - no recent exposure (last 10 days) testing not required  Diagnosis: Symptomatic anemia [[id number]]  Admitting Physician: Dolly Rias [[id number]]  Attending Physician: Dolly Rias [[id number]]          B Medical/Surgery History Past Medical History:  Diagnosis Date   Asthma    Chest pain    Past Surgical History:  Procedure Laterality Date   right foot surgery  1995 & 1996   spine sx  2001 and 07/2012     A IV Location/Drains/Wounds Patient Lines/Drains/Airways Status     Active Line/Drains/Airways     Name Placement date Placement time Site Days   Peripheral IV 05/06/23 18 G Left Antecubital 05/06/23  2025  Antecubital   1            Intake/Output Last 24 hours  Intake/Output Summary (Last 24 hours) at 05/07/2023 1444 Last data filed at 05/07/2023 0802 Gross per 24 hour  Intake 333.83 ml  Output --  Net 333.83 ml    Labs/Imaging Results for orders placed or performed during the hospital encounter of 05/06/23 (from the past 48 hour(s))  Lipase, blood      Status: None   Collection Time: 05/06/23  4:13 PM  Result Value Ref Range   Lipase 26 11 - 51 U/L    Comment: Performed at [facility], [address] [place], [place] [address]  Comprehensive metabolic panel     Status: Abnormal   Collection Time: 05/06/23  4:13 PM  Result Value Ref Range   Sodium 130 (L) 135 - 145 mmol/L   Potassium 4.5 3.5 - 5.1 mmol/L   Chloride 100 98 - 111 mmol/L   CO2 21 (L) 22 - 32 mmol/L   Glucose, Bld 112 (H) 70 - 99 mg/dL    Comment: Glucose reference range applies only to samples taken after fasting for at least 8 hours.   BUN 26 (H) 6 - 20 mg/dL   Creatinine, Ser 4.69 (H) 0.61 - 1.24 mg/dL   Calcium 9.1 8.9 - 62.9 mg/dL   Total Protein 6.6 6.5 - 8.1 g/dL   Albumin 1.8 (L) 3.5 - 5.0 g/dL   AST 69 (H) 15 - 41 U/L   ALT 31 0 - 44 U/L   Alkaline Phosphatase 115 38 - 126 U/L   Total Bilirubin 16.0 (H) 0.3 - 1.2 mg/dL   GFR, Estimated 47 (L) >60 mL/min    Comment: (NOTE) Calculated using the CKD-EPI Creatinine Equation (2021)    Anion gap 9 5 - 15    Comment: Performed  at [facility], [address], [place], [place] [address]  CBC     Status: Abnormal   Collection Time: 05/06/23  4:13 PM  Result Value Ref Range   WBC 8.6 4.0 - 10.5 K/uL   RBC 1.54 (L) 4.22 - 5.81 MIL/uL   Hemoglobin 6.7 (LL) 13.0 - 17.0 g/dL    Comment: REPEATED TO VERIFY THIS CRITICAL RESULT HAS VERIFIED AND BEEN CALLED TO S. BERTRAND, RN BY DAISY BLU ON 10 08 2024 AT 1700, AND HAS BEEN READ BACK.     HCT 20.1 (L) 39.0 - 52.0 %   MCV 130.5 (H) 80.0 - 100.0 fL   MCH 43.5 (H) 26.0 - 34.0 pg   MCHC 33.3 30.0 - 36.0 g/dL   RDW 60.4 (H) 54.0 - 98.1 %   Platelets 72 (L) 150 - 400 K/uL    Comment: Immature Platelet Fraction may be clinically indicated, consider ordering this additional test [id number] REPEATED TO VERIFY PLATELET COUNT CONFIRMED BY SMEAR    nRBC 0.0 0.0 - 0.2 %    Comment: Performed at [facility], [address], [place], [place] [address]   Type and screen [facility]     Status: None (Preliminary result)   Collection Time: 05/06/23  4:15 PM  Result Value Ref Range   ABO/RH(D) A POS    Antibody Screen NEG    Sample Expiration 05/09/2023,2359    Unit Number [id number]    Blood Component Type RED CELLS,LR    Unit division 00    Status of Unit ALLOCATED    Transfusion Status OK TO TRANSFUSE    Crossmatch Result Compatible    Unit Number [id number]    Blood Component Type RBC LR PHER2    Unit division 00    Status of Unit ISSUED,FINAL    Transfusion Status OK TO TRANSFUSE    Crossmatch Result      Compatible Performed at [facility], [address], [place], [place] [address]   Ammonia     Status: Abnormal   Collection Time: 05/06/23  8:26 PM  Result Value Ref Range   Ammonia 67 (H) 9 - 35 umol/L    Comment: Performed at [facility], [address] [place], [place] [address]  Brain natriuretic peptide     Status: Abnormal   Collection Time: 05/06/23  8:26 PM  Result Value Ref Range   B Natriuretic Peptide 197.7 (H) 0.0 - 100.0 pg/mL    Comment: Performed at [facility], [address] [place], [place] [address]  Prepare RBC (crossmatch)     Status: None   Collection Time: 05/06/23  8:27 PM  Result Value Ref Range   Order Confirmation      ORDER PROCESSED BY BLOOD BANK Performed at [facility], 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, [place], [place] [address]  HIV Antibody (routine testing w rflx)     Status: None   Collection Time: 05/07/23  4:06 AM  Result Value Ref Range   HIV Screen 4th Generation wRfx Non Reactive Non Reactive    Comment: Performed at [facility], [address] [place], [place] [address]  Basic metabolic panel     Status: Abnormal   Collection Time: 05/07/23  4:06 AM  Result Value Ref Range   Sodium 132 (L) 135 - 145 mmol/L   Potassium 4.4 3.5 - 5.1 mmol/L   Chloride 101 98 - 111 mmol/L   CO2 23 22 - 32 mmol/L   Glucose, Bld 115 (H) 70 - 99 mg/dL    Comment: Glucose reference range applies only to samples taken after fasting for at least 8 hours.   BUN 27 (H) 6 - 20 mg/dL   Creatinine, Ser 6.04 (H) 0.61 - 1.24 mg/dL   Calcium 9.3 8.9 - 54.0 mg/dL   GFR, Estimated 50 (L) >60 mL/min    Comment: (NOTE) Calculated using the CKD-EPI Creatinine Equation (2021)    Anion gap 8 5 - 15    Comment: Performed at [facility], [address] [place], [place] [address]  CBC     Status: Abnormal   Collection Time: 05/07/23  4:06 AM  Result Value Ref Range   WBC 8.8 4.0 - 10.5 K/uL   RBC 1.71 (L) 4.22 - 5.81 MIL/uL   Hemoglobin 7.2 (L) 13.0 - 17.0 g/dL   HCT 14.7 (L) 82.9 - 56.2 %   MCV 125.1 (H) 80.0 - 100.0 fL   MCH 42.1 (H) 26.0 - 34.0 pg   MCHC 33.6 30.0 - 36.0 g/dL   RDW 13.0 (H) 86.5 - 78.4 %   Platelets 71 (L) 150 - 400 K/uL    Comment: Immature Platelet Fraction may be clinically indicated, consider ordering this additional test [id number] REPEATED TO VERIFY    nRBC 0.0 0.0 - 0.2 %    Comment: Performed at [facility], [address] [place], [place] [address]  Magnesium     Status: Abnormal   Collection Time: 05/07/23  4:06 AM  Result Value Ref Range   Magnesium 1.5 (L) 1.7 - 2.4 mg/dL    Comment: Performed at [facility], [address], [place], [place] [address]  Phosphorus     Status: None   Collection Time: 05/07/23  4:06 AM  Result Value Ref Range   Phosphorus 3.5 2.5 - 4.6 mg/dL    Comment: Performed at [facility], [address], [place], [place]  [address]  Protime-INR     Status: Abnormal   Collection Time: 05/07/23  4:06 AM  Result Value Ref Range   Prothrombin Time 32.7 (H) 11.4 - 15.2 seconds   INR 3.2 (H) 0.8 - 1.2    Comment: (NOTE) INR goal varies based on device and disease states. Performed at [facility], [address] [place], [place] [address]   Hepatic function panel     Status: Abnormal   Collection Time: 05/07/23  4:06 AM  Result Value Ref Range   Total Protein 6.5 6.5 - 8.1 g/dL   Albumin 1.7 (L) 3.5 - 5.0 g/dL   AST 58 (H) 15 - 41 U/L   ALT 31 0 - 44 U/L   Alkaline Phosphatase 133 (H) 38 - 126 U/L   Total Bilirubin 15.3 (H) 0.3 - 1.2 mg/dL   Bilirubin, Direct 6.4 (H) 0.0 - 0.2 mg/dL   Indirect Bilirubin 8.9 (H) 0.3 - 0.9 mg/dL    Comment: Performed  at [facility], [address], [place], [place] [address]  Ferritin     Status: Abnormal   Collection Time: 05/07/23  4:06 AM  Result Value Ref Range   Ferritin 609 (H) 24 - 336 ng/mL    Comment: ICTERUS AT THIS LEVEL MAY AFFECT RESULT ICTERUS AT THIS LEVEL MAY AFFECT RESULT Performed at [facility], [address] [place], [place] [address]   Iron and TIBC     Status: Abnormal   Collection Time: 05/07/23  4:06 AM  Result Value Ref Range   Iron 196 (H) 45 - 182 ug/dL   TIBC 098 (L) 119 - 147 ug/dL   Saturation Ratios 84 (H) 17.9 - 39.5 %   UIBC 36 ug/dL    Comment: Performed at [facility], [address], [place], [place] [address]  Reticulocytes     Status: Abnormal   Collection Time: 05/07/23  4:06 AM  Result Value Ref Range   Retic Ct Pct 10.0 (H) 0.4 - 3.1 %   RBC. 1.71 (L) 4.22 - 5.81 MIL/uL   Retic Count, Absolute 171.0 19.0 - 186.0 K/uL   Immature Retic Fract 25.4 (H) 2.3 - 15.9 %    Comment: Performed at [facility], [address] [place], [place] [address]  Transferrin     Status: Abnormal   Collection Time: 05/07/23  4:06 AM  Result Value Ref Range   Transferrin 161 (L) 180 - 329 mg/dL    Comment:  Performed at [facility], [address] [place], [place] [address]  Vitamin B12     Status: Abnormal   Collection Time: 05/07/23  4:06 AM  Result Value Ref Range   Vitamin B-12 >7,500 (H) 180 - 914 pg/mL    Comment: ICTERUS AT THIS LEVEL MAY AFFECT RESULT (NOTE) This assay is not validated for testing neonatal or myeloproliferative syndrome specimens for Vitamin B12 levels. Performed at [facility], [address] [place], [place] [address]   Folate     Status: None   Collection Time: 05/07/23  4:06 AM  Result Value Ref Range   Folate 21.2 >5.9 ng/mL    Comment: ICTERUS AT THIS LEVEL MAY AFFECT RESULT Performed at [facility], [address], [place], [place] [address]   TSH     Status: None   Collection Time: 05/07/23  4:06 AM  Result Value Ref Range   TSH 2.227 0.350 - 4.500 uIU/mL    Comment: Performed by a 3rd Generation assay with a functional sensitivity of <=0.01 uIU/mL. Performed at [facility], [address] [place], [place] [address]   Differential     Status: Abnormal   Collection Time: 05/07/23  4:06 AM  Result Value Ref Range   Neutrophils Relative % 71 %   Neutro Abs 6.4 1.7 - 7.7 K/uL   Lymphocytes Relative 11 %   Lymphs Abs 0.9 0.7 - 4.0 K/uL   Monocytes Relative 13 %   Monocytes Absolute 1.1 (H) 0.1 - 1.0 K/uL   Eosinophils Relative 3 %   Eosinophils Absolute 0.3 0.0 - 0.5 K/uL   Basophils Relative 1 %   Basophils Absolute 0.1 0.0 - 0.1 K/uL   Immature Granulocytes 1 %   Abs Immature Granulocytes 0.06 0.00 - 0.07 K/uL    Comment: Performed at [facility], [address] [place], [place] [address]  Urinalysis, Routine w reflex microscopic -Urine, Clean Catch  Status: Abnormal   Collection Time: 05/07/23  7:41 AM  Result Value Ref Range   Color, Urine AMBER (A) YELLOW    Comment: BIOCHEMICALS MAY BE AFFECTED BY COLOR   APPearance CLEAR CLEAR   Specific Gravity, Urine 1.016 1.005 - 1.030   pH 6.0 5.0 - 8.0    Glucose, UA NEGATIVE NEGATIVE mg/dL   Hgb urine dipstick SMALL (A) NEGATIVE   Bilirubin Urine MODERATE (A) NEGATIVE   Ketones, ur NEGATIVE NEGATIVE mg/dL   Protein, ur NEGATIVE NEGATIVE mg/dL   Nitrite NEGATIVE NEGATIVE   Leukocytes,Ua NEGATIVE NEGATIVE   RBC / HPF 0-5 0 - 5 RBC/hpf   WBC, UA 0-5 0 - 5 WBC/hpf   Bacteria, UA NONE SEEN NONE SEEN   Squamous Epithelial / HPF 0-5 0 - 5 /HPF    Comment: Performed at [facility], [address], [place], [place] [address]  Protein / creatinine ratio, urine     Status: None   Collection Time: 05/07/23  7:41 AM  Result Value Ref Range   Creatinine, Urine 136 mg/dL   Total Protein, Urine 8 mg/dL    Comment: NO NORMAL RANGE ESTABLISHED FOR THIS TEST   Protein Creatinine Ratio 0.06 0.00 - 0.15 mg/mg[Cre]    Comment: Performed at [facility], [address] [place], [place] [address]   DG CHEST PORT 1 VIEW  Result Date: 05/06/2023 CLINICAL DATA:  Shortness of breath. EXAM: PORTABLE CHEST 1 VIEW COMPARISON:  10/01/2008. FINDINGS: Heart is enlarged and the mediastinal contour is within normal limits. Lung volumes are low with mild airspace disease at the left lung base. No effusion or pneumothorax. No acute osseous abnormality. IMPRESSION: Mild atelectasis or infiltrate at the left lung base. Electronically Signed   By: Thornell Sartorius M.D.   On: 05/06/2023 22:29    Pending Labs Unresulted Labs (From admission, onward)    None       Vitals/Pain Today's Vitals   05/07/23 1100 05/07/23 1225 05/07/23 1330 05/07/23 1400  BP: (!) 126/59 (!) 132/59 (!) 129/58 (!) 115/42  Pulse: (!) 108 (!) 110 (!) 108 (!) 110  Resp:  18 20 16   Temp:   98.4 F (36.9 C)   TempSrc:      SpO2: 99% 99% 100% 97%  PainSc:        Isolation Precautions No active isolations  Medications Medications  sodium chloride flush (NS) 0.9 % injection 3 mL (3 mLs Intravenous Given 05/07/23 0802)  lactulose (CHRONULAC) 10 GM/15ML solution 20 g (20 g Oral Given  05/07/23 0801)  acetaminophen (TYLENOL) tablet 500 mg (500 mg Oral Given 05/07/23 1329)  folic acid (FOLVITE) tablet 1 mg (1 mg Oral Given 05/07/23 0801)  multivitamin with minerals tablet 1 tablet (1 tablet Oral Given 05/07/23 0801)  thiamine (VITAMIN B1) tablet 100 mg (100 mg Oral Given 05/07/23 0801)  spironolactone (ALDACTONE) tablet 100 mg (100 mg Oral Given 05/07/23 0802)  albuterol (PROVENTIL) (2.5 MG/3ML) 0.083% nebulizer solution 3 mL (has no administration in time range)  0.9 %  sodium chloride infusion (Manually program via Guardrails IV Fluids) (0 mLs Intravenous Stopped 05/07/23 0229)  furosemide (LASIX) injection 20 mg (20 mg Intravenous Given 05/07/23 0014)    Mobility walks with person assist     Focused Assessments Cardiac Assessment Handoff:  Cardiac Rhythm: Sinus tachycardia No results found for: "CKTOTAL", "CKMB", "CKMBINDEX", "TROPONINI" No results found for: "DDIMER" Does the Patient currently have chest pain? No    R Recommendations: See  Admitting Provider Note  Report given to:   Additional Notes:

## 2023-05-07 NOTE — H&P (Addendum)
History and Physical    Charles Stone ZOX:[id number] DOB: January 05, 1966 DOA: 05/17/2023  PCP: Charles Manson, NP   Patient coming from: Home   Chief Complaint:  Chief Complaint  Patient presents with   Altered Mental Status   Abnormal Lab    HPI:  Charles Stone is a 57 y.o. male with hx of decompensated cirrhosis, hx alcohol use disorder in early remission, CKD stage III, anemia, thrombocytopenia, who presents with 2-week history of fatigue, and recent confusion.  Describes malaise, but also intermittent lightheadedness.  Over the past few days seeming to be slightly confused per family.  Also having imbalance for the past 2 days.  No dizziness or vertigo symptoms.  No bleeding sites, no hematochezia or melena.  Does have intermittent vomiting at nighttime, also without blood.  No associated abdominal pain.  Reports recent cough 3 weeks ago, which has continued and is dry.  No fevers.  Reports he has been getting B12 injections as an outpatient.    Review of Systems:  ROS complete and negative except as marked above   No Known Allergies  Prior to Admission medications   Medication Sig Start Date End Date Taking? Authorizing Provider  cetirizine (ZYRTEC) 10 MG tablet Take 10 mg by mouth daily.   Yes [provider]  cyanocobalamin (VITAMIN B12) 1000 MCG/ML injection Inject 100 mcg into the muscle once a week.   Yes [provider]  Ferrous Sulfate (IRON PO) Take 1 tablet by mouth daily.   Yes [provider]  furosemide (LASIX) 40 MG tablet Take 40 mg by mouth daily.   Yes [provider]  magnesium oxide (MAG-OX) 400 MG tablet Take 1 tablet (400 mg total) by mouth daily. 09/06/22  Yes Little Ishikawa, MD  PROAIR HFA 108 (90 BASE) MCG/ACT inhaler Inhale 1 puff into the lungs every 6 (six) hours as needed for wheezing or shortness of breath. 07/27/13  Yes [provider]  spironolactone (ALDACTONE) 50 MG tablet Take 1 tablet (50 mg total)  by mouth daily. 01/02/23  Yes Little Ishikawa, MD  testosterone cypionate (DEPOTESTOSTERONE CYPIONATE) 200 MG/ML injection Inject 100 mg into the muscle once a week.   Yes [provider]  cephALEXin (KEFLEX) 500 MG capsule Take 2 capsules (1,000 mg total) by mouth 2 (two) times daily for 7 days Patient not taking: Reported on 04/30/2023 02/11/23   Melene Plan, DO  furosemide (LASIX) 20 MG tablet Take 1 tablet (20 mg total) by mouth daily. Patient not taking: Reported on 05/07/2023 01/02/23   Little Ishikawa, MD  metoprolol tartrate (LOPRESSOR) 100 MG tablet Take 1 tablet (100mg ) TWO hours prior to CT scan Patient not taking: Reported on 12/26/2022 09/05/22   Little Ishikawa, MD    Past Medical History:  Diagnosis Date   Asthma    Chest pain     Past Surgical History:  Procedure Laterality Date   right foot surgery  1995 & 1996   spine sx  2001 and 07/2012     reports that he has never smoked. He has never used smokeless tobacco. He reports current alcohol use of about 20.0 standard drinks of alcohol per week. He reports that he does not use drugs.  Family History  Problem Relation Age of Onset   Cancer Mother    Alcohol abuse Mother    Alcohol abuse Father    Hypertension Sister    Diabetes Maternal Grandmother    Heart attack Maternal Grandfather  Physical Exam: Vitals:   05/15/2023 2139 05/28/2023 2145 05/19/2023 2201 05/07/23 0009  BP: (!) 113/56 (!) 112/54 (!) 100/33 (!) 113/54  Pulse: 94 95 96 (!) 101  Resp: 17 17 18 17   Temp: 98.4 F (36.9 C)  98.3 F (36.8 C) 98.7 F (37.1 C)  TempSrc: Oral  Oral   SpO2: 100% 100% 100%     Gen: Awake, alert, NAD, chronically ill-appearing CV: Regular, normal S1, S2, 2/6 SEM Resp: Normal WOB, decreased in the bases, otherwise clear Abd: Round, slightly distended, normoactive, nontender, dull of the flanks MSK: Symmetric, 3+ pitting edema tapers at the thigh Skin: Scattered telangiectasias.  Jaundiced. No  other rashes or lesions to exposed skin  Neuro: Alert and interactive, fully oriented.  Positive asterixis Psych: euthymic, appropriate    Data review:   Labs reviewed, notable for:   Hemoglobin 6.7, down from 7.1 in 7/'24 Platelet 72, similar to prior  NA 130 Bicarb 21, anion gap 9 Creatinine 1.68, similar to baseline 1.6 Albumin 1.8,  T. bili 16, down from 20 AST 69, ALT 31, alk phos 115; similar to prior BNP 197   Micro:  Results for orders placed or performed during the hospital encounter of 10/01/08  Urine culture     Status: None   Collection Time: 10/01/08 10:31 AM   Specimen: Urine, Random  Result Value Ref Range Status   Specimen Description URINE, RANDOM  Final   Special Requests NONE  Final   Colony Count NO GROWTH  Final   Culture NO GROWTH  Final   Report Status 10/02/2008 FINAL  Final    Imaging reviewed:  DG CHEST PORT 1 VIEW  Result Date: 05/20/2023 CLINICAL DATA:  Shortness of breath. EXAM: PORTABLE CHEST 1 VIEW COMPARISON:  10/01/2008. FINDINGS: Heart is enlarged and the mediastinal contour is within normal limits. Lung volumes are low with mild airspace disease at the left lung base. No effusion or pneumothorax. No acute osseous abnormality. IMPRESSION: Mild atelectasis or infiltrate at the left lung base. Electronically Signed   By: Thornell Sartorius M.D.   On: 05/27/2023 22:29    EKG:  Sinus rhythm, poor R wave progression, no acute ischemic changes.  ED Course:  Initially ordered for 2 unit RBC.  I changed to 1 unit after admission.   Assessment/Plan:  57 y.o. male with hx decompensated cirrhosis, hx alcohol use disorder in early remission, CKD stage III, anemia, thrombocytopenia, who presents with 2 weeks history of fatigue, worsening confusion.  Admitted for symptomatic anemia, new onset hepatic encephalopathy.   Symptomatic anemia Thrombocytopenia Hx Acute on chronic anemia, baseline around 7 when last checked in 7/'24.  Reportedly on B12  injections outpatient.  Does have marked macrocytosis, hyperchromic, and wide RDW.  Suspect mixed etiology with hypoproliferative anemia related to the liver disease, likely underlying nutritional deficiencies.  No reported history of bleeding.  Also with thrombocytopenia with platelet 72, similar to prior also feel secondary to liver disease. -Type and screened and consented in the ED. -Changed order to 1 unit RBC, repeat CBC in morning -No evidence of GI bleeding at this time -Check iron panel, folate, B12, reticulocyte count, smear given macrocytosis  Decompensated alcoholic cirrhosis Acute hepatic encephalopathy, grade 1, new onset Hyperbilirubinemia, improving - Meld-Na: 37  - HE: grade 1 with mild confusion, start lactulose TID, hold if > 4 BM within 24 hr. If persistent can add Rifaxamin  - EV: No prior EGD for screening, reports ordered for EGD in November. Strongly advised  him and family on need for variceal screening  - Ascites: Hx moderate ascites on prior imaging. See diuretics below, once of IV lasix, would resume on home lasix 20 mg PO. Continue Spironolactone 50 mg daily.  - Previously following with [facility]. Per patient prefers to see a different hepatologist, will need a referral to hepatology.   Lower extremity edema, question heart failure 3+ lower extremity edema at the thighs, BNP elevated at 197.  Chest x-ray with mild atelectasis or infiltrate at the left base.  Does have systolic ejection murmur on exam -Lasix 20 mg IV x 1 -TTE  Imbalance: PT evaluation  Chronic medical problems:  History alcohol use disorder in early remission: Abstinent from alcohol for 5 weeks.  Started on multivitamin, thiamine, folate. CKD stage III: Baseline creatinine around 1.6 History of testosterone use: Unclear evaluation for hypogonadism in outpatient setting.  For now advised to avoid use of testosterone due to risks of clotting especially with his underlying liver disease.   Advised for outpatient follow-up with endocrinology, and discussion of testosterone use with his hepatologist.   MELD 3.0: 38 at 02/11/2023  9:19 AM MELD-Na: 37 at 02/11/2023  9:19 AM Calculated from: Serum Creatinine: 1.63 mg/dL at 0/98/1191  4:78 AM Serum Sodium: 129 mmol/L at 02/11/2023  9:19 AM Total Bilirubin: 20.0 mg/dL at 2/95/6213  0:86 AM Serum Albumin: 2.7 g/dL at 5/78/4696  2:95 AM INR(ratio): 3.4 at 02/11/2023  9:19 AM Age at listing (hypothetical): 77 years Sex: Male at 02/11/2023  9:19 AM    There is no height or weight on file to calculate BMI.    DVT prophylaxis:  SCDs Code Status:  Full Code Diet:  Diet Orders (From admission, onward)     Start     Ordered   May 07, 2023 2139  Diet 2 gram sodium Room service appropriate? Yes; Fluid consistency: Thin  Diet effective now       Question Answer Comment  Room service appropriate? Yes   Fluid consistency: Thin      05/07/2023 2144           Family Communication: Yes discussed with his wife and son at bedside Consults: None Admission status:   Observation, Telemetry bed  Severity of Illness: The appropriate patient status for this patient is OBSERVATION. Observation status is judged to be reasonable and necessary in order to provide the required intensity of service to ensure the patient's safety. The patient's presenting symptoms, physical exam findings, and initial radiographic and laboratory data in the context of their medical condition is felt to place them at decreased risk for further clinical deterioration. Furthermore, it is anticipated that the patient will be medically stable for discharge from the hospital within 2 midnights of admission.    Dolly Rias, MD [facility]  How to contact the [facility] Attending or Consulting provider 7A - 7P or covering provider during after hours 7P -7A, for this patient.  Check the care team in [facility] and look for a) attending/consulting [facility] provider listed and b) the [facility]  team listed Log into [email or web address] and use [place]'s universal password to access. If you do not have the password, please contact the hospital operator. Locate the [facility] provider you are looking for under [facility] and page to a number that you can be directly reached. If you still have difficulty reaching the provider, please page the [facility] (Director on Call) for the Hospitalists listed on amion for assistance.  05/07/2023, 1:45 AM

## 2023-05-08 ENCOUNTER — Inpatient Hospital Stay (HOSPITAL_COMMUNITY): Payer: BC Managed Care – PPO

## 2023-05-08 DIAGNOSIS — D649 Anemia, unspecified: Secondary | ICD-10-CM | POA: Diagnosis not present

## 2023-05-08 LAB — COMPREHENSIVE METABOLIC PANEL
ALT: 31 U/L (ref 0–44)
ALT: 32 U/L (ref 0–44)
AST: 66 U/L — ABNORMAL HIGH (ref 15–41)
AST: 62 U/L — ABNORMAL HIGH (ref 15–41)
Albumin: 1.7 g/dL — ABNORMAL LOW (ref 3.5–5.0)
Albumin: 1.7 g/dL — ABNORMAL LOW (ref 3.5–5.0)
Alkaline Phosphatase: 99 U/L (ref 38–126)
Alkaline Phosphatase: 94 U/L (ref 38–126)
Anion gap: 19 — ABNORMAL HIGH (ref 5–15)
Anion gap: 18 — ABNORMAL HIGH (ref 5–15)
BUN: 36 mg/dL — ABNORMAL HIGH (ref 6–20)
BUN: 36 mg/dL — ABNORMAL HIGH (ref 6–20)
CO2: 13 mmol/L — ABNORMAL LOW (ref 22–32)
CO2: 14 mmol/L — ABNORMAL LOW (ref 22–32)
Calcium: 9 mg/dL (ref 8.9–10.3)
Calcium: 9 mg/dL (ref 8.9–10.3)
Chloride: 98 mmol/L (ref 98–111)
Chloride: 98 mmol/L (ref 98–111)
Creatinine, Ser: 2.59 mg/dL — ABNORMAL HIGH (ref 0.61–1.24)
Creatinine, Ser: 2.72 mg/dL — ABNORMAL HIGH (ref 0.61–1.24)
GFR, Estimated: 28 mL/min — ABNORMAL LOW (ref >60)
GFR, Estimated: 26 mL/min — ABNORMAL LOW (ref >60)
Glucose, Bld: 112 mg/dL — ABNORMAL HIGH (ref 70–99)
Glucose, Bld: 118 mg/dL — ABNORMAL HIGH (ref 70–99)
Potassium: 4.7 mmol/L (ref 3.5–5.1)
Potassium: 4.6 mmol/L (ref 3.5–5.1)
Sodium: 130 mmol/L — ABNORMAL LOW (ref 135–145)
Sodium: 130 mmol/L — ABNORMAL LOW (ref 135–145)
Total Bilirubin: 18.8 mg/dL (ref 0.3–1.2)
Total Bilirubin: 19.3 mg/dL (ref 0.3–1.2)
Total Protein: 6.2 g/dL — ABNORMAL LOW (ref 6.5–8.1)
Total Protein: 6.4 g/dL — ABNORMAL LOW (ref 6.5–8.1)

## 2023-05-08 LAB — BLOOD CULTURE ID PANEL (REFLEXED) - BCID2

## 2023-05-08 LAB — BLOOD GAS, ARTERIAL
Acid-base deficit: 7.6 mmol/L — ABNORMAL HIGH (ref 0.0–2.0)
Acid-base deficit: 11.5 mmol/L — ABNORMAL HIGH (ref 0.0–2.0)
Bicarbonate: 16 mmol/L — ABNORMAL LOW (ref 20.0–28.0)
Bicarbonate: 13.3 mmol/L — ABNORMAL LOW (ref 20.0–28.0)
Drawn by: 54887
O2 Saturation: 96.8 %
O2 Saturation: 95.6 %
Patient temperature: 37.6
Patient temperature: 37
pCO2 arterial: 28 mm[Hg] — ABNORMAL LOW (ref 32–48)
pCO2 arterial: 27 mm[Hg] — ABNORMAL LOW (ref 32–48)
pH, Arterial: 7.37 (ref 7.35–7.45)
pH, Arterial: 7.3 — ABNORMAL LOW (ref 7.35–7.45)
pO2, Arterial: 76 mm[Hg] — ABNORMAL LOW (ref 83–108)
pO2, Arterial: 71 mm[Hg] — ABNORMAL LOW (ref 83–108)

## 2023-05-08 LAB — CBC
HCT: 21.2 % — ABNORMAL LOW (ref 39.0–52.0)
Hemoglobin: 7 g/dL — ABNORMAL LOW (ref 13.0–17.0)
MCH: 42.7 pg — ABNORMAL HIGH (ref 26.0–34.0)
MCHC: 33 g/dL (ref 30.0–36.0)
MCV: 129.3 fL — ABNORMAL HIGH (ref 80.0–100.0)
Platelets: 67 10*3/uL — ABNORMAL LOW (ref 150–400)
RBC: 1.64 MIL/uL — ABNORMAL LOW (ref 4.22–5.81)
RDW: 21.2 % — ABNORMAL HIGH (ref 11.5–15.5)
WBC: 32.6 10*3/uL — ABNORMAL HIGH (ref 4.0–10.5)
nRBC: 0.1 % (ref 0.0–0.2)

## 2023-05-08 LAB — PROTIME-INR
INR: 4.4 (ref 0.8–1.2)
INR: 2.7 — ABNORMAL HIGH (ref 0.8–1.2)
Prothrombin Time: 41.9 s — ABNORMAL HIGH (ref 11.4–15.2)
Prothrombin Time: 29.1 s — ABNORMAL HIGH (ref 11.4–15.2)

## 2023-05-08 LAB — GLUCOSE, CAPILLARY
Glucose-Capillary: 105 mg/dL — ABNORMAL HIGH (ref 70–99)
Glucose-Capillary: 107 mg/dL — ABNORMAL HIGH (ref 70–99)
Glucose-Capillary: 121 mg/dL — ABNORMAL HIGH (ref 70–99)
Glucose-Capillary: 137 mg/dL — ABNORMAL HIGH (ref 70–99)
Glucose-Capillary: 139 mg/dL — ABNORMAL HIGH (ref 70–99)
Glucose-Capillary: 141 mg/dL — ABNORMAL HIGH (ref 70–99)

## 2023-05-08 LAB — AMMONIA: Ammonia: 36 umol/L — ABNORMAL HIGH (ref 9–35)

## 2023-05-08 LAB — LACTIC ACID, PLASMA
Lactic Acid, Venous: >9 mmol/L (ref 0.5–1.9)
Lactic Acid, Venous: >9 mmol/L (ref 0.5–1.9)

## 2023-05-08 LAB — SODIUM, URINE, RANDOM: Sodium, Ur: 29 mmol/L

## 2023-05-08 LAB — APTT: aPTT: 59 s — ABNORMAL HIGH (ref 24–36)

## 2023-05-08 LAB — MAGNESIUM: Magnesium: 1.5 mg/dL — ABNORMAL LOW (ref 1.7–2.4)

## 2023-05-08 MED ORDER — SODIUM CHLORIDE 0.9% FLUSH
3.0000 mL | Freq: Two times a day (BID) | INTRAVENOUS | Status: DC
  Administered 2023-05-08 – 2023-05-10 (×5): 3 mL via INTRAVENOUS

## 2023-05-08 MED ORDER — LACTULOSE ENEMA
300.0000 mL | Freq: Three times a day (TID) | ORAL | Status: DC
  Administered 2023-05-08 – 2023-05-09 (×5): 300 mL via RECTAL
  Filled 2023-05-08 (×7): qty 300

## 2023-05-08 MED ORDER — SODIUM CHLORIDE 0.9 % IV SOLN
2.0000 g | INTRAVENOUS | Status: DC
  Administered 2023-05-08 – 2023-05-09 (×2): 2 g via INTRAVENOUS
  Filled 2023-05-08 (×2): qty 20

## 2023-05-08 MED ORDER — NOREPINEPHRINE 4 MG/250ML-% IV SOLN
2.0000 ug/min | INTRAVENOUS | Status: DC
  Administered 2023-05-08: 2 ug/min via INTRAVENOUS
  Administered 2023-05-10: 4 ug/min via INTRAVENOUS
  Filled 2023-05-08 (×2): qty 250

## 2023-05-08 MED ORDER — SODIUM CHLORIDE 0.9 % IV BOLUS
500.0000 mL | Freq: Once | INTRAVENOUS | Status: AC
  Administered 2023-05-08: 500 mL via INTRAVENOUS

## 2023-05-08 MED ORDER — ACETAMINOPHEN 650 MG RE SUPP: 650.0000 mg | Freq: Four times a day (QID) | RECTAL | Status: DC | PRN

## 2023-05-08 MED ORDER — SODIUM CHLORIDE 0.9% IV SOLUTION: Freq: Once | INTRAVENOUS | Status: AC

## 2023-05-08 MED ORDER — ALBUMIN HUMAN 25 % IV SOLN
50.0000 g | Freq: Four times a day (QID) | INTRAVENOUS | Status: AC
  Administered 2023-05-08 (×2): 50 g via INTRAVENOUS
  Filled 2023-05-08 (×2): qty 200

## 2023-05-08 MED ORDER — SODIUM CHLORIDE 0.9 % IV SOLN: INTRAVENOUS | Status: DC

## 2023-05-08 MED ORDER — CHLORHEXIDINE GLUCONATE CLOTH 2 % EX PADS
6.0000 | MEDICATED_PAD | Freq: Every day | CUTANEOUS | Status: DC
  Administered 2023-05-08 – 2023-05-12 (×5): 6 via TOPICAL

## 2023-05-08 MED ORDER — SODIUM BICARBONATE 8.4 % IV SOLN
100.0000 meq | Freq: Once | INTRAVENOUS | Status: AC
  Administered 2023-05-08: 100 meq via INTRAVENOUS
  Filled 2023-05-08: qty 50

## 2023-05-08 MED ORDER — ACETAMINOPHEN 500 MG PO TABS
500.0000 mg | ORAL_TABLET | Freq: Four times a day (QID) | ORAL | Status: DC | PRN
  Administered 2023-05-10 – 2023-05-11 (×2): 500 mg via ORAL
  Filled 2023-05-08 (×2): qty 1

## 2023-05-08 MED ORDER — SODIUM BICARBONATE 8.4 % IV SOLN
INTRAVENOUS | Status: AC
  Filled 2023-05-08: qty 50

## 2023-05-08 MED ORDER — DEXMEDETOMIDINE HCL IN NACL 400 MCG/100ML IV SOLN
0.0000 ug/kg/h | INTRAVENOUS | Status: DC
  Administered 2023-05-08 (×2): 0.7 ug/kg/h via INTRAVENOUS
  Administered 2023-05-08: 0.4 ug/kg/h via INTRAVENOUS
  Administered 2023-05-09: 0.7 ug/kg/h via INTRAVENOUS
  Filled 2023-05-08: qty 100
  Filled 2023-05-08: qty 200
  Filled 2023-05-08: qty 100

## 2023-05-08 MED ORDER — SODIUM BICARBONATE 8.4 % IV SOLN
50.0000 meq | Freq: Once | INTRAVENOUS | Status: AC
  Administered 2023-05-08: 50 meq via INTRAVENOUS
  Filled 2023-05-08: qty 50

## 2023-05-08 MED ORDER — IOHEXOL 350 MG/ML SOLN
75.0000 mL | Freq: Once | INTRAVENOUS | Status: AC | PRN
  Administered 2023-05-08: 75 mL via INTRAVENOUS

## 2023-05-08 MED ORDER — SODIUM CHLORIDE 0.9 % IV SOLN
250.0000 mL | INTRAVENOUS | Status: DC
  Administered 2023-05-09: 250 mL via INTRAVENOUS

## 2023-05-08 MED ORDER — MAGNESIUM SULFATE 4 GM/100ML IV SOLN
4.0000 g | Freq: Once | INTRAVENOUS | Status: AC
  Administered 2023-05-08: 4 g via INTRAVENOUS
  Filled 2023-05-08: qty 100

## 2023-05-08 MED ORDER — PANTOPRAZOLE SODIUM 40 MG IV SOLR
40.0000 mg | Freq: Every day | INTRAVENOUS | Status: DC
  Administered 2023-05-08 – 2023-05-10 (×3): 40 mg via INTRAVENOUS
  Filled 2023-05-08 (×3): qty 10

## 2023-05-08 NOTE — Progress Notes (Signed)
NAME:  Charles Stone, MRN:  [id number], DOB:  12-10-1965, LOS: 1 ADMISSION DATE:  06/01/2023, CONSULTATION DATE:  10/10 REFERRING MD:  Jacqulyn Bath, CHIEF COMPLAINT:  Hepatic encephalopathy   History of Present Illness:  Charles Stone is a 57yo M w/ hx of cirrhosis, alcohol use disorder, CKD stage3, anemia, thrombocytopenia who was admitted 10/9 for 2wks of confusion, found to have hepatic encephalopathy. Pt was being treated w/ lactulose,  but then became acutely obtunded ON and was sent to ICU.  Pertinent  Medical History  Decompensated liver cirrhosis, hx of alcohol use disorder (last drink August), CKD3, anemia, thrombocytopenia  Significant Hospital Events: Including procedures, antibiotic start and stop dates in addition to other pertinent events   10/9 Admitted for hepatic encephalopathy to [facility] 10/10 Acutely obtunded, transfer to ICU  Interim History / Subjective:  Pt became acutely obtunded, transferred to ICU.   Per family, he was talking, recognized family members, and joking around 8pm last night.   Objective   Blood pressure (!) 113/37, pulse (!) 118, temperature (!) 97.5 F (36.4 C), temperature source Axillary, resp. rate (!) 26, weight 97.6 kg, SpO2 96%.       No intake or output data in the 24 hours ending 05/08/23 0943 Filed Weights   05/08/23 0900  Weight: 97.6 kg    Examination: General: Chronically ill man, altered and rolling around in bed. Neuro: Moving around in bed, not oriented and not following commands. Occasionally kicking legs and grasping edge of bed with hands. HENT: Sclera icteric BL. PERRLA. MMM. NCAT. Lungs: CTAB, no crackles or wheezing. On RA Cardiovascular: RRR, no murmurs Abdomen: Distended. Umbilical hernia noted. BS present. Extremities: 2+ BL LE edema Skin: diffusely jaundiced.  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  Pt was being treated w/ lactulose, but looks like not having goal amount of Bms, then became acutely obtunded ON. CT head showed chronic subdural hydromas w/o significant mass effect. Differential includes progression of hepatic encephalopathy, SBP, and uremia 2/2 cardiorenal. Hepatic encephalopathy is considered given pt does not seem to have had adequate stool output with lactulose; will switch to lactulose enema. SBP is considered, although pt has been afebrile. Pt has been empirically started on CTX and will f/u CT A/P/C. Bcx were also drawn. Uremia is considered given acute decline in renal function.  - Transferred to ICU - Meld Na 43 - Lactulose enema TID, goal of 3 BM daily - Cont CTX for empiric SBP coverage.  - If INR improves, consider paracentesis  - f/u Bcx  - Monitor fever curve - f/u CT A/P/C - Precedex drip for agitation. - Mitts and soft restraints - Place flexiseal - Once able to PO, restart multivitamin, thiamine, and folic acid - Fall and seizure precautions - Trend CMP, CBC - If neuro status worsens, consider repeat CT head to monitor hygromas   Chronic Anemia  Chronic Thrombocytopenia S/p 1u pRBC Jun 01, 2023. Hgb at baseline (~7), plt at baseline (~  70K).  - Cont IV protonix - Transfuse <7 - Monitor for signs of bleeding, pt has no hx of screening EGD so varices could be present. Needs outpt EGD.     Elevated PT/INR INR 4.4, likely 2/2 hepatic dysfunction.  - give 4u FFP - Repeat INR ~4pm  - Goal INR<2  - If INR improved, plan for paracentesis and potential NG tube  Anion Gap  Metabolic Acidosis  Lactic Acidosis ABG c/w metabolic acidosis ph 7.3. Anion gap 18. Lactic acid >9. Suspect chronic build up of lactic acid 2/2 cirrhosis. Reassuringly, pt maintaining adequate MAPs, satting well on RA. Pt is tachy cardic, but this is could be due to acute agitation. - Give bicarb - Trend metabolic panel  Acute on Chronic Kidney Disease  Cardiorenal Syndrome?  Acute rise in Cr from 1.59 to 2.72. Suspect poor profusion 2/2 cardiorenal. Plan to give NS and albumin to replete intravascular volume. S/p 500cc bolus NS this AM.  - Consult Nephrology, appreciate recs - NS 100cc/hr for 24hrs - Albumin 50g x2 doses - Strict I/Os - Trend CMP, Mg, Phos   Best Practice (right click and "Reselect all SmartList Selections" daily)   Diet/type: NPO DVT prophylaxis: SCD GI prophylaxis: PPI Lines: PIV Foley:  N/A Code Status:  full code Last date of multidisciplinary goals of care discussion spoke w/ family at bedside [10/10]  Labs   CBC: Recent Labs  Lab 2023/05/10 1613 05/07/23 0406 05/08/23 0815  WBC 8.6 8.8 32.6*  NEUTROABS  --  6.4  --   HGB 6.7* 7.2* 7.0*  HCT 20.1* 21.4* 21.2*  MCV 130.5* 125.1* 129.3*  PLT 72* 71* 67*    Basic Metabolic Panel: Recent Labs  Lab 05-10-23 1613 05/07/23 0406 05/08/23 0719  NA 130* 132* 130*  K 4.5 4.4 4.7  CL 100 101 98  CO2 21* 23 13*  GLUCOSE 112* 115* 112*  BUN 26* 27* 36*  CREATININE 1.68* 1.59* 2.59*  CALCIUM 9.1 9.3 9.0  MG  --  1.5*  --   PHOS  --  3.5  --    GFR: Estimated Creatinine Clearance: 35.4 mL/min (A) (by C-G formula based on SCr of 2.59 mg/dL (H)). Recent Labs  Lab 05/10/2023 1613 05/07/23 0406 05/08/23 0719 05/08/23 0815  WBC 8.6 8.8  --  32.6*  LATICACIDVEN  --   --  >9.0* >9.0*    Liver Function Tests: Recent Labs  Lab 05/10/2023 1613 05/07/23 0406 05/08/23 0719  AST 69* 58* 66*  ALT 31 31 31   ALKPHOS 115 133* 99  BILITOT 16.0* 15.3* 18.8*  PROT 6.6 6.5 6.2*  ALBUMIN 1.8* 1.7*  1.7*   Recent Labs  Lab 05-10-2023 1613  LIPASE 26   Recent Labs  Lab 10-May-2023 2026 05/08/23 0815  AMMONIA 67* 36*    ABG    Component Value Date/Time   PHART 7.3 (L) 05/08/2023 0855   PCO2ART 27 (L) 05/08/2023 0855   PO2ART 71 (L) 05/08/2023 0855   HCO3 13.3 (L) 05/08/2023 0855   TCO2 23 10/01/2008 1016   ACIDBASEDEF 11.5 (H) 05/08/2023 0855   O2SAT 95.6 05/08/2023 0855     Coagulation Profile: Recent Labs  Lab 05/07/23 0406 05/08/23 0719  INR 3.2* 4.4*    Cardiac Enzymes: No results for input(s): "CKTOTAL", "CKMB", "CKMBINDEX", "TROPONINI" in the last 168 hours.  HbA1C: No results found for: "HGBA1C"  CBG: Recent Labs  Lab 05/08/23 0301 05/08/23 0933  GLUCAP 107* 105*     Past Medical  History:  He,  has a past medical history of Asthma and Chest pain.   Surgical History:   Past Surgical History:  Procedure Laterality Date   right foot surgery  1995 & 1996   spine sx  2001 and 07/2012     Social History:   reports that he has never smoked. He has never used smokeless tobacco. He reports current alcohol use of about 20.0 standard drinks of alcohol per week. He reports that he does not use drugs.   Family History:  His family history includes Alcohol abuse in his father and mother; Cancer in his mother; Diabetes in his maternal grandmother; Heart attack in his maternal grandfather; Hypertension in his sister.   Allergies No Known Allergies   Home Medications  Prior to Admission medications   Medication Sig Start Date End Date Taking? Authorizing Provider  cetirizine (ZYRTEC) 10 MG tablet Take 10 mg by mouth daily.   Yes [provider]  cyanocobalamin (VITAMIN B12) 1000 MCG/ML injection Inject 100 mcg into the muscle once a week.   Yes [provider]  Ferrous Sulfate (IRON PO) Take 1 tablet by mouth daily.   Yes [provider]  furosemide (LASIX) 40 MG tablet Take 40 mg by mouth daily.   Yes [provider]   magnesium oxide (MAG-OX) 400 MG tablet Take 1 tablet (400 mg total) by mouth daily. 09/06/22  Yes Little Ishikawa, MD  PROAIR HFA 108 (90 BASE) MCG/ACT inhaler Inhale 1 puff into the lungs every 6 (six) hours as needed for wheezing or shortness of breath. 07/27/13  Yes [provider]  spironolactone (ALDACTONE) 50 MG tablet Take 1 tablet (50 mg total) by mouth daily. 01/02/23  Yes Little Ishikawa, MD  testosterone cypionate (DEPOTESTOSTERONE CYPIONATE) 200 MG/ML injection Inject 100 mg into the muscle once a week.   Yes [provider]   Lincoln Brigham MD, PGY2

## 2023-05-08 NOTE — Progress Notes (Signed)
[name] [id number] Admission Data: 05/08/2023 11:35 AM Attending Provider: Hughie Closs, MD  EXB:[id number], [name], NP Consults/ Treatment Team:   Charles Stone is a 57 y.o. male patient admitted from the floor awake, alert  not following commands,  Full Code, VSS - Blood pressure (!) 113/37, pulse (!) 118, temperature 98 F (36.7 C), temperature source Axillary, resp. rate (!) 26, weight 97.6 kg, SpO2 96%., room air.          Tele # [id number] placed and pt is currently running:sinus tachycardia.   IV site WDL:  forearm left, condition patent and no redness with a transparent dsg that's clean dry and intact.  Skin, clean-dry- intact without evidence of bruising, or skin tears.   No evidence of skin break down noted on exam. Admit to 3 [place] room 11.     Will cont to monitor and assist as needed.  Phineas Douglas, RN 05/08/2023 11:35 AM

## 2023-05-08 NOTE — Progress Notes (Signed)
Patient admitted for symptomatic anemia and altered mental status. History of alcohol abuse and cirrhosis. Patient is now experiencing tremors, his pupils are dilated and slow to react, patient has increased respirations, his eyes are open but he is not responding to verbal stimuli. Patient is responsive to painful stimuli. Current vitals are temp 100.3, blood pressure 103/44, pulse 135, respirations 36. Patient now has a MEWS score of 8, ICU rapid nurse called to bedside to evaluate patient. Webb Silversmith, NP Hospitalist notified. New order for MRI of the brain and an EKG received. EKG obtained and shows sinus tachycardia at 138bpm. O[place], NP stated that she would have an on campus doctor evaluate the patient at bedside, waiting for doctor to come to bedside. No medications given due to patient not being alert enough to take oral medication.  Vital signs MEWS 3.0 Red Guidelines will be obtained hourly x 2 and then every 4 hours until patient remains green for 12 hours. Girl friend is at bedside and has been updated.

## 2023-05-08 NOTE — Progress Notes (Signed)
   05/08/23 0746  Assess: MEWS Score  Temp (!) 97.5 F (36.4 C)  BP (!) 113/37  MAP (mmHg) (!) 60  Pulse Rate (!) 118  Resp (!) 26  Level of Consciousness Responds to Pain  SpO2 96 %  Assess: MEWS Score  MEWS Temp 0  MEWS Systolic 0  MEWS Pulse 2  MEWS RR 2  MEWS LOC 2  MEWS Score 6  MEWS Score Color Red  Assess: if the MEWS score is Yellow or Red  Were vital signs accurate and taken at a resting state? Yes  Does the patient meet 2 or more of the SIRS criteria? Yes  Does the patient have a confirmed or suspected source of infection? No  MEWS guidelines implemented  No, previously red, continue vital signs every 4 hours  Notify: Charge Nurse/RN  Name of Charge Nurse/RN Notified Scribner, RN  Provider Notification  Provider Name/Title Pahwani MD  Date Provider Notified 05/08/23  Time Provider Notified 0720  Method of Notification Page (secure chat)  Notification Reason Change in status  Assess: SIRS CRITERIA  SIRS Temperature  0  SIRS Pulse 1  SIRS Respirations  1  SIRS WBC 0  SIRS Score Sum  2

## 2023-05-08 NOTE — Progress Notes (Signed)
2026  AMMONIA 67*   Coagulation Profile: Recent Labs  Lab 05/07/23 0406 05/08/23 0719  INR 3.2* 4.4*   Cardiac Enzymes: No results for input(s): "CKTOTAL", "CKMB", "CKMBINDEX", "TROPONINI" in the last 168 hours. BNP (last 3 results) No results for input(s): "PROBNP" in the last 8760 hours. HbA1C: No results for input(s): "HGBA1C" in the last 72 hours. CBG: Recent Labs  Lab 05/08/23 0301  GLUCAP 107*   Lipid Profile: No results for input(s): "CHOL", "HDL", "LDLCALC", "TRIG", "CHOLHDL", "LDLDIRECT" in the last 72 hours. Thyroid Function Tests: Recent Labs    05/07/23 0406  TSH 2.227   Anemia Panel: Recent Labs    05/07/23 0406  VITAMINB12 >7,500*  FOLATE 21.2  FERRITIN 609*  TIBC 232*  IRON 196*  RETICCTPCT 10.0*   Sepsis Labs: Recent Labs  Lab 05/08/23 0719  LATICACIDVEN >9.0*    No results found for this or any previous visit (from the past 240 hour(s)).   Radiology Studies: DG Abd 1 View  Result Date: 05/08/2023 CLINICAL DATA:  57 year old male metal screening for planned MRI. EXAM: ABDOMEN - 1 VIEW COMPARISON:  Head CT 0040 hours today. CT Abdomen and Pelvis 04/01/2021. FINDINGS: Three portable AP supine  views of the abdomen and pelvis at 0430 hours. Occasional EKG leads. No metallic or radiopaque foreign body identified in the abdomen or pelvis. Patchy nonspecific lung base opacity. Paucity of bowel gas. No acute osseous abnormality identified. IMPRESSION: 1. No retained metal foreign body in the abdomen or pelvis. 2. Nonobstructed bowel-gas pattern. Nonspecific patchy lung base opacity. Electronically Signed   By: Odessa Fleming M.D.   On: 05/08/2023 05:11   CT HEAD WO CONTRAST ( )  Result Date: 05/08/2023 CLINICAL DATA:  Initial evaluation for mental status change, worsening headache. EXAM: CT HEAD WITHOUT CONTRAST TECHNIQUE: Contiguous axial images were obtained from the base of the skull through the vertex without intravenous contrast. RADIATION DOSE REDUCTION: This exam was performed according to the departmental dose-optimization program which includes automated exposure control, adjustment of the mA and/or kV according to patient size and/or use of iterative reconstruction technique. COMPARISON:  Prior CT from 10/01/2008. FINDINGS: Brain: Cerebral volume within normal limits. No acute intracranial hemorrhage. No acute large vessel territory infarct. No mass lesion. Ventricles normal size without hydrocephalus. 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 These measure up to 3-4 mm in maximal thickness. No significant mass effect or midline shift. Vascular: No abnormal hyperdense vessel. Calcified atherosclerosis present about the skull base. Skull: Scalp soft tissues demonstrate no acute finding. Calvarium intact. Sinuses/Orbits: Globes and orbital soft tissues within normal limits. Mild mucosal thickening present about the ethmoidal air cells and maxillary sinuses. Paranasal sinuses are otherwise clear. No mastoid effusion. Other: None. IMPRESSION: 1. 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 These measure up to 3-4 mm in maximal thickness. No significant mass effect or midline shift. 2. No other acute intracranial abnormality. Electronically Signed   By: Rise Mu M.D.   On: 05/08/2023 02:20   ECHOCARDIOGRAM COMPLETE  Result Date: 05/07/2023    ECHOCARDIOGRAM REPORT   Patient Name:   Charles Stone Date of Exam: 05/07/2023 Medical Rec #:  [id number]    Height:       69.0 in Accession #:    [id number]   Weight:       208.0 lb Date of Birth:  [birth year]    BSA:  PROGRESS NOTE    BRICESON BROADWATER  XBM:[id number] DOB: November 21, 1965 DOA: 05/06/2023 PCP: April Manson, NP   Brief Narrative:  57 y.o. male with hx of decompensated alcoholic cirrhosis, hx alcohol use disorder in early remission, CKD stage III, anemia, thrombocytopenia, who presents with 2-week history of fatigue, and recent confusion admitted on 05/07/2023 with acute hepatic encephalopathy   Assessment & Plan:   Principal Problem:   Symptomatic anemia Active Problems:   Hepatic encephalopathy (HCC)  Decompensated alcoholic liver cirrhosis with acute encephalopathy: Serum ammonia 67 upon presentation.  That was 2 days ago, no repeat has been ordered.  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  No midline shift or any signs of a stroke.  MRI brain is ordered and is pending.  On my encounter today, patient appears to be quite obtunded, although responding to verbal commands by opening the eyes but unable to speak anything and unable to follow commands due to significant weakness.  I have ordered repeat ammonia which is pending.  MRI is pending.  Lactic acid is elevated at 9.  No previous lactic acid available in recent past.  Could be his baseline due to history of cirrhosis.  Getting ABGs.  No BMs charted.  Per nurse at the bedside, he had very small BM last night.  Looks like he was given all 3 doses of lactulose in last 24 hours.  I do not think patient will be able to tolerate p.o. lactulose, will switch to lactulose enema 3 times daily.  I have also ordered ultrasound abdomen to check for ascites.  May need paracentesis.  He is already on Rocephin.  Cultures are negative.  Patient's INR as well as bilirubin and PT, all of them are rising. Patient usually sees Dr.Karl Pleasant and Dr Concepcion Elk at [facility] digestive  health -in [place] -He scheduled for EGD for evaluation of varices/portal hypertension--in November 2024 with above GI practitioners.  History of alcohol abuse: Per chart, looks like his last alcoholic drink was in August 2024.  Continue multivitamin, thiamine and folic acid.  Chronic hyponatremia: Stable.  Chronic macrocytic anemia and thrombocytopenia secondary to chronic alcoholism: Hemoglobin dropped to 6.7 upon presentation and he received 1 unit of PRBC transfusion and hemoglobin was 7.2 post transfusion.  Repeat CBC is pending.  Will transfuse if less than 7.  His platelets are stable.  No signs of bleeding.  Lower extremity edema: On my examination, he appears to have +2 left lower extremity edema but nothing on the right.  Echo was done 05/07/2023 which showed normal ejection fraction.  This is likely secondary to cirrhosis.  AKI on CKD stage IIIa/high anion gap metabolic acidosis: Baseline creatinine appears to be around 1.6, increased to 2.6 today.  It looks like he did have 1 episode of low blood pressure 98/72 last night around 10 PM, blood pressure is better now with tachycardia.  Appears dehydrated.  Will need IV fluids, starting on normal saline at 100 cc/h.  Lactic acid is also elevated, will repeat lactic acid in few hours.  With tachycardia, and all what is going on with him, he appears to be critically sick, we will transfer him to progressive care unit.  I have paged critical care team.  Generalized weakness: PT OT to eval.  Hypomagnesemia: Was low yesterday, unsure if he was replaced.  Checking magnesium  2026  AMMONIA 67*   Coagulation Profile: Recent Labs  Lab 05/07/23 0406 05/08/23 0719  INR 3.2* 4.4*   Cardiac Enzymes: No results for input(s): "CKTOTAL", "CKMB", "CKMBINDEX", "TROPONINI" in the last 168 hours. BNP (last 3 results) No results for input(s): "PROBNP" in the last 8760 hours. HbA1C: No results for input(s): "HGBA1C" in the last 72 hours. CBG: Recent Labs  Lab 05/08/23 0301  GLUCAP 107*   Lipid Profile: No results for input(s): "CHOL", "HDL", "LDLCALC", "TRIG", "CHOLHDL", "LDLDIRECT" in the last 72 hours. Thyroid Function Tests: Recent Labs    05/07/23 0406  TSH 2.227   Anemia Panel: Recent Labs    05/07/23 0406  VITAMINB12 >7,500*  FOLATE 21.2  FERRITIN 609*  TIBC 232*  IRON 196*  RETICCTPCT 10.0*   Sepsis Labs: Recent Labs  Lab 05/08/23 0719  LATICACIDVEN >9.0*    No results found for this or any previous visit (from the past 240 hour(s)).   Radiology Studies: DG Abd 1 View  Result Date: 05/08/2023 CLINICAL DATA:  57 year old male metal screening for planned MRI. EXAM: ABDOMEN - 1 VIEW COMPARISON:  Head CT 0040 hours today. CT Abdomen and Pelvis 04/01/2021. FINDINGS: Three portable AP supine  views of the abdomen and pelvis at 0430 hours. Occasional EKG leads. No metallic or radiopaque foreign body identified in the abdomen or pelvis. Patchy nonspecific lung base opacity. Paucity of bowel gas. No acute osseous abnormality identified. IMPRESSION: 1. No retained metal foreign body in the abdomen or pelvis. 2. Nonobstructed bowel-gas pattern. Nonspecific patchy lung base opacity. Electronically Signed   By: Odessa Fleming M.D.   On: 05/08/2023 05:11   CT HEAD WO CONTRAST ( )  Result Date: 05/08/2023 CLINICAL DATA:  Initial evaluation for mental status change, worsening headache. EXAM: CT HEAD WITHOUT CONTRAST TECHNIQUE: Contiguous axial images were obtained from the base of the skull through the vertex without intravenous contrast. RADIATION DOSE REDUCTION: This exam was performed according to the departmental dose-optimization program which includes automated exposure control, adjustment of the mA and/or kV according to patient size and/or use of iterative reconstruction technique. COMPARISON:  Prior CT from 10/01/2008. FINDINGS: Brain: Cerebral volume within normal limits. No acute intracranial hemorrhage. No acute large vessel territory infarct. No mass lesion. Ventricles normal size without hydrocephalus. 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 These measure up to 3-4 mm in maximal thickness. No significant mass effect or midline shift. Vascular: No abnormal hyperdense vessel. Calcified atherosclerosis present about the skull base. Skull: Scalp soft tissues demonstrate no acute finding. Calvarium intact. Sinuses/Orbits: Globes and orbital soft tissues within normal limits. Mild mucosal thickening present about the ethmoidal air cells and maxillary sinuses. Paranasal sinuses are otherwise clear. No mastoid effusion. Other: None. IMPRESSION: 1. 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 These measure up to 3-4 mm in maximal thickness. No significant mass effect or midline shift. 2. No other acute intracranial abnormality. Electronically Signed   By: Rise Mu M.D.   On: 05/08/2023 02:20   ECHOCARDIOGRAM COMPLETE  Result Date: 05/07/2023    ECHOCARDIOGRAM REPORT   Patient Name:   Charles Stone Date of Exam: 05/07/2023 Medical Rec #:  [id number]    Height:       69.0 in Accession #:    [id number]   Weight:       208.0 lb Date of Birth:  [birth year]    BSA:  2026  AMMONIA 67*   Coagulation Profile: Recent Labs  Lab 05/07/23 0406 05/08/23 0719  INR 3.2* 4.4*   Cardiac Enzymes: No results for input(s): "CKTOTAL", "CKMB", "CKMBINDEX", "TROPONINI" in the last 168 hours. BNP (last 3 results) No results for input(s): "PROBNP" in the last 8760 hours. HbA1C: No results for input(s): "HGBA1C" in the last 72 hours. CBG: Recent Labs  Lab 05/08/23 0301  GLUCAP 107*   Lipid Profile: No results for input(s): "CHOL", "HDL", "LDLCALC", "TRIG", "CHOLHDL", "LDLDIRECT" in the last 72 hours. Thyroid Function Tests: Recent Labs    05/07/23 0406  TSH 2.227   Anemia Panel: Recent Labs    05/07/23 0406  VITAMINB12 >7,500*  FOLATE 21.2  FERRITIN 609*  TIBC 232*  IRON 196*  RETICCTPCT 10.0*   Sepsis Labs: Recent Labs  Lab 05/08/23 0719  LATICACIDVEN >9.0*    No results found for this or any previous visit (from the past 240 hour(s)).   Radiology Studies: DG Abd 1 View  Result Date: 05/08/2023 CLINICAL DATA:  57 year old male metal screening for planned MRI. EXAM: ABDOMEN - 1 VIEW COMPARISON:  Head CT 0040 hours today. CT Abdomen and Pelvis 04/01/2021. FINDINGS: Three portable AP supine  views of the abdomen and pelvis at 0430 hours. Occasional EKG leads. No metallic or radiopaque foreign body identified in the abdomen or pelvis. Patchy nonspecific lung base opacity. Paucity of bowel gas. No acute osseous abnormality identified. IMPRESSION: 1. No retained metal foreign body in the abdomen or pelvis. 2. Nonobstructed bowel-gas pattern. Nonspecific patchy lung base opacity. Electronically Signed   By: Odessa Fleming M.D.   On: 05/08/2023 05:11   CT HEAD WO CONTRAST ( )  Result Date: 05/08/2023 CLINICAL DATA:  Initial evaluation for mental status change, worsening headache. EXAM: CT HEAD WITHOUT CONTRAST TECHNIQUE: Contiguous axial images were obtained from the base of the skull through the vertex without intravenous contrast. RADIATION DOSE REDUCTION: This exam was performed according to the departmental dose-optimization program which includes automated exposure control, adjustment of the mA and/or kV according to patient size and/or use of iterative reconstruction technique. COMPARISON:  Prior CT from 10/01/2008. FINDINGS: Brain: Cerebral volume within normal limits. No acute intracranial hemorrhage. No acute large vessel territory infarct. No mass lesion. Ventricles normal size without hydrocephalus. 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 These measure up to 3-4 mm in maximal thickness. No significant mass effect or midline shift. Vascular: No abnormal hyperdense vessel. Calcified atherosclerosis present about the skull base. Skull: Scalp soft tissues demonstrate no acute finding. Calvarium intact. Sinuses/Orbits: Globes and orbital soft tissues within normal limits. Mild mucosal thickening present about the ethmoidal air cells and maxillary sinuses. Paranasal sinuses are otherwise clear. No mastoid effusion. Other: None. IMPRESSION: 1. 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 These measure up to 3-4 mm in maximal thickness. No significant mass effect or midline shift. 2. No other acute intracranial abnormality. Electronically Signed   By: Rise Mu M.D.   On: 05/08/2023 02:20   ECHOCARDIOGRAM COMPLETE  Result Date: 05/07/2023    ECHOCARDIOGRAM REPORT   Patient Name:   Charles Stone Date of Exam: 05/07/2023 Medical Rec #:  [id number]    Height:       69.0 in Accession #:    [id number]   Weight:       208.0 lb Date of Birth:  [birth year]    BSA:  2026  AMMONIA 67*   Coagulation Profile: Recent Labs  Lab 05/07/23 0406 05/08/23 0719  INR 3.2* 4.4*   Cardiac Enzymes: No results for input(s): "CKTOTAL", "CKMB", "CKMBINDEX", "TROPONINI" in the last 168 hours. BNP (last 3 results) No results for input(s): "PROBNP" in the last 8760 hours. HbA1C: No results for input(s): "HGBA1C" in the last 72 hours. CBG: Recent Labs  Lab 05/08/23 0301  GLUCAP 107*   Lipid Profile: No results for input(s): "CHOL", "HDL", "LDLCALC", "TRIG", "CHOLHDL", "LDLDIRECT" in the last 72 hours. Thyroid Function Tests: Recent Labs    05/07/23 0406  TSH 2.227   Anemia Panel: Recent Labs    05/07/23 0406  VITAMINB12 >7,500*  FOLATE 21.2  FERRITIN 609*  TIBC 232*  IRON 196*  RETICCTPCT 10.0*   Sepsis Labs: Recent Labs  Lab 05/08/23 0719  LATICACIDVEN >9.0*    No results found for this or any previous visit (from the past 240 hour(s)).   Radiology Studies: DG Abd 1 View  Result Date: 05/08/2023 CLINICAL DATA:  57 year old male metal screening for planned MRI. EXAM: ABDOMEN - 1 VIEW COMPARISON:  Head CT 0040 hours today. CT Abdomen and Pelvis 04/01/2021. FINDINGS: Three portable AP supine  views of the abdomen and pelvis at 0430 hours. Occasional EKG leads. No metallic or radiopaque foreign body identified in the abdomen or pelvis. Patchy nonspecific lung base opacity. Paucity of bowel gas. No acute osseous abnormality identified. IMPRESSION: 1. No retained metal foreign body in the abdomen or pelvis. 2. Nonobstructed bowel-gas pattern. Nonspecific patchy lung base opacity. Electronically Signed   By: Odessa Fleming M.D.   On: 05/08/2023 05:11   CT HEAD WO CONTRAST ( )  Result Date: 05/08/2023 CLINICAL DATA:  Initial evaluation for mental status change, worsening headache. EXAM: CT HEAD WITHOUT CONTRAST TECHNIQUE: Contiguous axial images were obtained from the base of the skull through the vertex without intravenous contrast. RADIATION DOSE REDUCTION: This exam was performed according to the departmental dose-optimization program which includes automated exposure control, adjustment of the mA and/or kV according to patient size and/or use of iterative reconstruction technique. COMPARISON:  Prior CT from 10/01/2008. FINDINGS: Brain: Cerebral volume within normal limits. No acute intracranial hemorrhage. No acute large vessel territory infarct. No mass lesion. Ventricles normal size without hydrocephalus. 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 These measure up to 3-4 mm in maximal thickness. No significant mass effect or midline shift. Vascular: No abnormal hyperdense vessel. Calcified atherosclerosis present about the skull base. Skull: Scalp soft tissues demonstrate no acute finding. Calvarium intact. Sinuses/Orbits: Globes and orbital soft tissues within normal limits. Mild mucosal thickening present about the ethmoidal air cells and maxillary sinuses. Paranasal sinuses are otherwise clear. No mastoid effusion. Other: None. IMPRESSION: 1. 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 These measure up to 3-4 mm in maximal thickness. No significant mass effect or midline shift. 2. No other acute intracranial abnormality. Electronically Signed   By: Rise Mu M.D.   On: 05/08/2023 02:20   ECHOCARDIOGRAM COMPLETE  Result Date: 05/07/2023    ECHOCARDIOGRAM REPORT   Patient Name:   Charles Stone Date of Exam: 05/07/2023 Medical Rec #:  [id number]    Height:       69.0 in Accession #:    [id number]   Weight:       208.0 lb Date of Birth:  [birth year]    BSA:  PROGRESS NOTE    BRICESON BROADWATER  XBM:[id number] DOB: November 21, 1965 DOA: 05/06/2023 PCP: April Manson, NP   Brief Narrative:  57 y.o. male with hx of decompensated alcoholic cirrhosis, hx alcohol use disorder in early remission, CKD stage III, anemia, thrombocytopenia, who presents with 2-week history of fatigue, and recent confusion admitted on 05/07/2023 with acute hepatic encephalopathy   Assessment & Plan:   Principal Problem:   Symptomatic anemia Active Problems:   Hepatic encephalopathy (HCC)  Decompensated alcoholic liver cirrhosis with acute encephalopathy: Serum ammonia 67 upon presentation.  That was 2 days ago, no repeat has been ordered.  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  No midline shift or any signs of a stroke.  MRI brain is ordered and is pending.  On my encounter today, patient appears to be quite obtunded, although responding to verbal commands by opening the eyes but unable to speak anything and unable to follow commands due to significant weakness.  I have ordered repeat ammonia which is pending.  MRI is pending.  Lactic acid is elevated at 9.  No previous lactic acid available in recent past.  Could be his baseline due to history of cirrhosis.  Getting ABGs.  No BMs charted.  Per nurse at the bedside, he had very small BM last night.  Looks like he was given all 3 doses of lactulose in last 24 hours.  I do not think patient will be able to tolerate p.o. lactulose, will switch to lactulose enema 3 times daily.  I have also ordered ultrasound abdomen to check for ascites.  May need paracentesis.  He is already on Rocephin.  Cultures are negative.  Patient's INR as well as bilirubin and PT, all of them are rising. Patient usually sees Dr.Karl Pleasant and Dr Concepcion Elk at [facility] digestive  health -in [place] -He scheduled for EGD for evaluation of varices/portal hypertension--in November 2024 with above GI practitioners.  History of alcohol abuse: Per chart, looks like his last alcoholic drink was in August 2024.  Continue multivitamin, thiamine and folic acid.  Chronic hyponatremia: Stable.  Chronic macrocytic anemia and thrombocytopenia secondary to chronic alcoholism: Hemoglobin dropped to 6.7 upon presentation and he received 1 unit of PRBC transfusion and hemoglobin was 7.2 post transfusion.  Repeat CBC is pending.  Will transfuse if less than 7.  His platelets are stable.  No signs of bleeding.  Lower extremity edema: On my examination, he appears to have +2 left lower extremity edema but nothing on the right.  Echo was done 05/07/2023 which showed normal ejection fraction.  This is likely secondary to cirrhosis.  AKI on CKD stage IIIa/high anion gap metabolic acidosis: Baseline creatinine appears to be around 1.6, increased to 2.6 today.  It looks like he did have 1 episode of low blood pressure 98/72 last night around 10 PM, blood pressure is better now with tachycardia.  Appears dehydrated.  Will need IV fluids, starting on normal saline at 100 cc/h.  Lactic acid is also elevated, will repeat lactic acid in few hours.  With tachycardia, and all what is going on with him, he appears to be critically sick, we will transfer him to progressive care unit.  I have paged critical care team.  Generalized weakness: PT OT to eval.  Hypomagnesemia: Was low yesterday, unsure if he was replaced.  Checking magnesium

## 2023-05-08 NOTE — Progress Notes (Signed)
   05/07/23 2355  Assess: MEWS Score  Temp 99.3 F (37.4 C)  BP (!) 127/57  MAP (mmHg) 78  Pulse Rate (!) 111  Assess: MEWS Score  MEWS Temp 0  MEWS Systolic 0  MEWS Pulse 2  MEWS RR 0  MEWS LOC 0  MEWS Score 2  MEWS Score Color Yellow  Assess: if the MEWS score is Yellow or Red  Were vital signs accurate and taken at a resting state? Yes  Does the patient meet 2 or more of the SIRS criteria? No  MEWS guidelines implemented  Yes, yellow  Treat  MEWS Interventions Considered administering scheduled or prn medications/treatments as ordered  Take Vital Signs  Increase Vital Sign Frequency  Yellow: Q2hr x1, continue Q4hrs until patient remains green for 12hrs  Escalate  MEWS: Escalate Yellow: Discuss with charge nurse and consider notifying provider and/or RRT  Notify: Charge Nurse/RN  Name of Charge Nurse/RN Notified Windell Moulding, RN  Provider Notification  Provider Name/Title Anna Genre, NP  Date Provider Notified 05/07/23  Time Provider Notified 2359  Method of Notification Page  Notification Reason Other (Comment) (New MEWS score, Yellow)  Provider response See new orders  Date of Provider Response 05/08/23  Time of Provider Response 0006  Assess: SIRS CRITERIA  SIRS Temperature  0  SIRS Pulse 1  SIRS Respirations  0  SIRS WBC 0  SIRS Score Sum  1

## 2023-05-08 NOTE — Progress Notes (Signed)
   05/08/23 0200  Assess: MEWS Score  Temp 100.2 F (37.9 C)  BP (!) 103/44  MAP (mmHg) (!) 61  Pulse Rate (!) 134  Resp (!) 36  SpO2 97 %  O2 Device Room Air  Assess: MEWS Score  MEWS Temp 0  MEWS Systolic 0  MEWS Pulse 3  MEWS RR 3  MEWS LOC 0  MEWS Score 6  MEWS Score Color Red  Assess: if the MEWS score is Yellow or Red  Were vital signs accurate and taken at a resting state? Yes  Does the patient meet 2 or more of the SIRS criteria? No  MEWS guidelines implemented  Yes, red  Treat  MEWS Interventions Considered administering scheduled or prn medications/treatments as ordered  Take Vital Signs  Increase Vital Sign Frequency  Red: Q1hr x2, continue Q4hrs until patient remains green for 12hrs  Escalate  MEWS: Escalate Red: Discuss with charge nurse and notify provider. Consider notifying RRT. If remains red for 2 hours consider need for higher level of care  Notify: Charge Nurse/RN  Name of Charge Nurse/RN Notified Windell Moulding, RN  Provider Notification  Provider Name/Title Anna Genre, NP  Date Provider Notified 05/08/23  Time Provider Notified 0230  Method of Notification Page  Notification Reason Change in status  Provider response See new orders  Date of Provider Response 05/08/23  Time of Provider Response 0240  Assess: SIRS CRITERIA  SIRS Temperature  0  SIRS Pulse 1  SIRS Respirations  1  SIRS WBC 0  SIRS Score Sum  2

## 2023-05-08 NOTE — Progress Notes (Signed)
MEWS Progress Note  Patient Details Name: Charles Stone MRN: [id number] DOB: 05-04-1966 Today's Date: 05/08/2023   MEWS Flowsheet Documentation:  Assess: MEWS Score Temp: 100.2 F (37.9 C) BP: (!) 103/44 MAP (mmHg): (!) 61 Pulse Rate: (!) 134 ECG Heart Rate: (!) 108 Resp: (!) 36 Level of Consciousness: Responds to Pain SpO2: 97 % O2 Device: Room Air Patient Activity (if Appropriate): In bed Assess: MEWS Score MEWS Temp: 0 MEWS Systolic: 0 MEWS Pulse: 3 MEWS RR: 3 MEWS LOC: 2 MEWS Score: 8 MEWS Score Color: Red Assess: SIRS CRITERIA SIRS Temperature : 0 SIRS Respirations : 1 SIRS Pulse: 1 SIRS WBC: 0 SIRS Score Sum : 2 SIRS Temperature : 0 SIRS Pulse: 1 SIRS Respirations : 1 SIRS WBC: 0 SIRS Score Sum : 2 Assess: if the MEWS score is Yellow or Red Were vital signs accurate and taken at a resting state?: Yes Does the patient meet 2 or more of the SIRS criteria?: No MEWS guidelines implemented : Yes, red Treat MEWS Interventions: Considered administering scheduled or prn medications/treatments as ordered Take Vital Signs Increase Vital Sign Frequency : Red: Q1hr x2, continue Q4hrs until patient remains green for 12hrs Escalate MEWS: Escalate: Red: Discuss with charge nurse and notify provider. Consider notifying RRT. If remains red for 2 hours consider need for higher level of care        Erinne Gillentine K Vann-Bray 05/08/2023, 3:38 AM

## 2023-05-08 NOTE — Progress Notes (Addendum)
eLink Physician-Brief Progress Note Patient Name: Charles Stone DOB: [birth year] MRN: [id number]   Date of Service  05/08/2023  HPI/Events of Note  57yo M w/ hx of cirrhosis, alcohol use disorder, CKD stage3, anemia, thrombocytopenia who was admitted 10/9 for 2wks of confusion, found to have hepatic encephalopathy.   BC + for [facility]  eICU Interventions  Maintain ceftriaxone, no indication for repeat BC or ECHO.   May benefit from paracentesis to confirm SBP   2310 -add peripheral norepinephrine for maintenance of blood pressure, MAP goal greater than 65  0106 -patient persistently agitated.  Restrained bilateral wrist, ankle, and Posey belt.  Orders updated  0416 -bilirubin 22.6.  No intervention.  Hemoglobin 6.1.  Transfuse 1 unit PRBC.  INR 4.0.  Unclear coagulation status-hold further transfusion at this time.  Intervention Category Minor Interventions: Routine modifications to care plan (e.g. PRN medications for pain, fever)  Deanna Wiater 05/08/2023, 10:30 PM

## 2023-05-08 NOTE — Progress Notes (Signed)
Patient admitted for symptomatic anemia and altered mental status. History of alcohol abuse and cirrhosis. Patient was complaining of nausea and stated that he has a headache and feels like his head is going to pop. Patient given zofran for nausea. Left room to send the hospitalist a message regarding the headache, the patients girl friend stated patient had vomited and laid on the floor in the bathroom. This nurse saw dark green emesis around the toilet. Patient stated he felt dizzy so he laid down. Patient was able to stand up and sit down on the toilet. Patient vitals were as follows Temp 99.3, BP 127/57 MAP 78, heart rate 111. Patient was able to stand and walk back to bed with the assistance of a front wheel walker. Patient is now back in bed but states his head is still hurting. Patient states his last drink was 5 weeks ago. Hospitalist was notified, CT of head was ordered.

## 2023-05-08 NOTE — Plan of Care (Signed)
  Problem: Education: Goal: Knowledge of General Education information will improve Description: Including pain rating scale, medication(s)/side effects and non-pharmacologic comfort measures Outcome: Not Progressing   Problem: Health Behavior/Discharge Planning: Goal: Ability to manage health-related needs will improve Outcome: Not Progressing   Problem: Elimination: Goal: Will not experience complications related to bowel motility Outcome: Not Progressing

## 2023-05-08 NOTE — Progress Notes (Addendum)
TRH CROSS COVER NOTE  Patient Name: Charles Stone   MRN: [id number]   Date of Birth/ Sex: [birth year] , male      Admission Date: 05/06/2023  Attending Provider: Shon Hale, MD  Primary Diagnosis: Symptomatic anemia   57 y.o. male with hx of decompensated alcoholic cirrhosis, hx alcohol use disorder in early remission, CKD stage III, anemia, thrombocytopenia, who presents with 2-week history of fatigue, and recent confusion admitted on 05/07/2023 with acute hepatic encephalopathy.  Significant Hospital Events 10/8: Admitted to floor unit with decompensated alcoholic liver cirrhosis with acute hepatic encephalopathy  10/10: Rapid response overnight for c/o worst headache,  temp 100.3, blood pressure 103/44, pulse 135, respirations 36.   I have reviewed all the lab results and noted abnormalities that need follow up.  ABG    Component Value Date/Time   PHART 7.37 05/08/2023 0450   PCO2ART 28 (L) 05/08/2023 0450   PO2ART 76 (L) 05/08/2023 0450   HCO3 16.0 (L) 05/08/2023 0450   TCO2 23 10/01/2008 1016   ACIDBASEDEF 7.6 (H) 05/08/2023 0450   O2SAT 96.8 05/08/2023 0450   CT Head IMPRESSION: 1. 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 These measure up to 3-4 mm in maximal thickness. No significant mass effect or midline shift. 2. No other acute intracranial abnormality.         Assessment & Plan #Decompensated Alcoholic Liver Cirrhosis #Acute on Chronic Anemia #Thrombocytopenia  -keep NPO -Holding Octreotide has no evidence of varices bleed -Protonix 40 mg IV BID -Empiric Ceftriaxone 1g q24h x7 days for SBP prophylaxis -Monitor for S/Sx of bleeding -Trend CBC (H&H q6h) -SCD's for VTE Prophylaxis (chemical ppx contraindicated) -Transfuse for Hgb <7  (has received 1 units pRBC's so far)  #AKI #Anion Gap Metabolic Acidosis with Lactic Acidosis #Hyponatremia -Monitor I&O's / urinary  output -Follow BMP -Ensure adequate renal perfusion -Avoid nephrotoxic agents as able -Replace electrolytes as indicated -2 amps Bicarb -Trend Lactic   #Sepsis likely of intraabdominal source meets SIRS criteria: Heart Rate 134 beats/minute, Respiratory Rate 36 breaths/minute,Temperature 100.2 -Obtain CT chest abd/pelvis -Supplemental oxygen as needed, to maintain SpO2 > 90% -Monitor fever curve -Trend WBC's & Procalcitonin -Follow cultures as above -start empiric Ceftriaxone for SBP ppx pending cultures & sensitivities   #Toxic Metabolic Encephalopathy #Hepatic Encephalopathy -CTH shows chronic subdural hygromas, due to change in neuro status will follow up MRI Brain -Hold sedatives -Trend Ammonia -Continue Lactulose -High risk for decompensation would consider Transfer to higher level of care        Webb Silversmith, DNP, CCRN, FNP-C, AGACNP-BC Acute Care & Family Nurse Practitioner  [facility]  See Amion for personal pager PCCM on call pager [phone] until 7 am

## 2023-05-08 NOTE — Progress Notes (Signed)
   05/08/23 1610  Assess: MEWS Score  Temp 97.9 F (36.6 C)  BP (!) 126/48  MAP (mmHg) 69  Pulse Rate (!) 125  Resp (!) 30  SpO2 99 %  O2 Device Room Air  Assess: MEWS Score  MEWS Temp 0  MEWS Systolic 0  MEWS Pulse 2  MEWS RR 2  MEWS LOC 2  MEWS Score 6  MEWS Score Color Red  Assess: if the MEWS score is Yellow or Red  Were vital signs accurate and taken at a resting state? Yes  Does the patient meet 2 or more of the SIRS criteria? Yes  Does the patient have a confirmed or suspected source of infection? No  MEWS guidelines implemented  No, previously red, continue vital signs every 4 hours  Assess: SIRS CRITERIA  SIRS Temperature  0  SIRS Pulse 1  SIRS Respirations  1  SIRS WBC 0  SIRS Score Sum  2

## 2023-05-08 NOTE — Progress Notes (Addendum)
MEWS Progress Note  Patient Details Name: Charles Stone MRN: [id number] DOB: 23-Sep-1965 Today's Date: 05/08/2023   MEWS Flowsheet Documentation:  Assess: MEWS Score Temp: 100.2 F (37.9 C) BP: (!) 103/44 MAP (mmHg): (!) 61 Pulse Rate: (!) 134 ECG Heart Rate: (!) 108 Resp: (!) 36 Level of Consciousness: Responds to Pain SpO2: 97 % O2 Device: Room Air Patient Activity (if Appropriate): In bed Assess: MEWS Score MEWS Temp: 0 MEWS Systolic: 0 MEWS Pulse: 3 MEWS RR: 3 MEWS LOC: 2 MEWS Score: 8 MEWS Score Color: Red Assess: SIRS CRITERIA SIRS Temperature : 0 SIRS Respirations : 1 SIRS Pulse: 1 SIRS WBC: 0 SIRS Score Sum : 2     Asked to see [name] due to high MEWS. Pt was reportedly walking around prior to CT scan approx. 0100. Pt is arousable to loud voice or touch. Disoriented and not following commands. CBG 107. Tachycardic and tachypneic. Skin dry and hot to touch, jaundiced with tenting and poor skin turgor in upper extremities, anasarca and pitting BLE edema. Pt has voided once 10 hours ago. Oral mucosa dry. Upper extremities with tremors when stimulated. PERRLA slowly. His HR was 105-110 for the last 24 hours and now is 136. RR has increased from 15-20 to 36.     0200-100.64F ax, HR 134 ST, 103/44 (61), RR 36 with sats 97% on RA.   CT Scan: IMPRESSION: 1. 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 These measure up to 3-4 mm in maximal thickness. No significant mass effect or midline shift. 2. No other acute intracranial abnormality.    Electronically Signed   By: Rise Mu M.D.   On: 05/08/2023 02:20  Recommend MD evaluation and possible escalation in level of care.   Rose Fillers 05/08/2023, 3:09 AM

## 2023-05-08 NOTE — TOC CM/SW Note (Signed)
Transition of Care [facility]) - Inpatient Brief Assessment   Patient Details  Name: Charles Stone MRN: [id number] Date of Birth: 05-31-1966  Transition of Care [facility]) CM/SW Contact:    Tom-Johnson, Hershal Coria, RN Phone Number: 05/08/2023, 4:20 PM   Clinical Narrative:  Patient presents to the ED with AMS and Lethargy.  Admitted with Hepatic Encephalopathy 10/09, became Acutely obtunded 10/10 and transferred to ICU. Currently on Room Air, Oriented to Self. Has Flexiseal. On Lactulose.  CXR showed Small Lt Pleural Effusion, CT head showed chronic Subdural Hydromas w/o significant Mass Effect.   Has Hx of Decompensated Cirrhosis 2/2 Alcohol.   CM spoke with family at bedside. Patient's three children and Significant Other at bedside.  Patient lives with Significant Other, daughter and grandchildren. Independent with care and drive self prior to admission. Employed. Does not have DME's at home.  PCP is April Manson, NP and uses [employer] [facility] in [place].  Awaits PT/OT eval for disposition.  Patient not Medically ready for discharge.  CM will continue to follow as patient progresses with care towards discharge.           Transition of Care Asessment: Insurance and Status: Insurance coverage has been reviewed Patient has primary care physician: Yes Home environment has been reviewed: Yes Prior level of function:: Independent Prior/Current Home Services: No current home services Social Determinants of Health Reivew: SDOH reviewed no interventions necessary Readmission risk has been reviewed: Yes Transition of care needs: transition of care needs identified, TOC will continue to follow

## 2023-05-08 NOTE — Progress Notes (Signed)
OT Cancellation Note  Patient Details Name: Charles Stone MRN: [id number] DOB: 23-Dec-1965   Cancelled Treatment:    Reason Eval/Treat Not Completed: Patient not medically ready Rapid response called overnight. Per RN, pt is transferring to 65M and requests holding therapy at this time. Will follow up tomorrow for OT eval.   Lorre Munroe 05/08/2023, 9:00 AM

## 2023-05-08 NOTE — Consult Note (Signed)
[name]  INPATIENT CONSULTATION  Reason for Consultation: AKI Requesting Provider: Dr. Lonzo Candy  HPI: Charles Stone is an 57 y.o. male with alcoholic cirrhosis, anemia, CKD 3 admitted with AMS and nephrology is consulted for evaluation and management of AKI.   Patient currently obtunded in ICU and no family present.  History obtained from chart.     Presented from home 2 days ago with fatigue and confusion x 2 weeks.  Afebrile with BP in the 110s.  Admitted with Hb 6.1, transfused.  Started lactulose for encephalopathy.  Continued on lasix and aldactone with h/o ascites.   This AM became obtunded and was transferred to ICU.  Lactulose intensified, restrained, on precedex for agitation.   CT a/p with contrast this AM - no major acute issues.    Hypothermic with lowest BP in the 90s, no pressors required.   UOP today.   Labs showing Cr normal in 12/2022 > 01/2023 1.6 > 1.7 on admission and up to 2.6 this AM.   Sodium stable 130, normal K, bicarb 14, alb 1.7, bilirubin 19, UA no protein, +blood (none on micro) urine sodium 29.  Receiving albumin IV and NS 100/hr.   TTE normal.   PMH: Past Medical History:  Diagnosis Date   Asthma    Chest pain    PSH: Past Surgical History:  Procedure Laterality Date   right foot surgery  1995 & 1996   spine sx  2001 and 07/2012    Past Medical History:  Diagnosis Date   Asthma    Chest pain     Medications:  I have reviewed the patient's current medications.  Medications Prior to Admission  Medication Sig Dispense Refill   cetirizine (ZYRTEC) 10 MG tablet Take 10 mg by mouth daily.     cyanocobalamin (VITAMIN B12) 1000 MCG/ML injection Inject 100 mcg into the muscle once a week.     Ferrous Sulfate (IRON PO) Take 1 tablet by mouth daily.     furosemide (LASIX) 40 MG tablet Take 40 mg by mouth daily.     magnesium oxide (MAG-OX) 400 MG tablet Take 1 tablet (400 mg total) by mouth daily. 90 tablet 3   PROAIR HFA 108 (90  BASE) MCG/ACT inhaler Inhale 1 puff into the lungs every 6 (six) hours as needed for wheezing or shortness of breath.     spironolactone (ALDACTONE) 50 MG tablet Take 1 tablet (50 mg total) by mouth daily.     testosterone cypionate (DEPOTESTOSTERONE CYPIONATE) 200 MG/ML injection Inject 100 mg into the muscle once a week.      ALLERGIES:  No Known Allergies  FAM HX: Family History  Problem Relation Age of Onset   Cancer Mother    Alcohol abuse Mother    Alcohol abuse Father    Hypertension Sister    Diabetes Maternal Grandmother    Heart attack Maternal Grandfather     Social History:   reports that he has never smoked. He has never used smokeless tobacco. He reports current alcohol use of about 20.0 standard drinks of alcohol per week. He reports that he does not use drugs.  ROS: unable to obtain ROS due to patient AMS  Blood pressure 103/77, pulse 73, temperature (!) 97 F (36.1 C), temperature source Axillary, resp. rate (!) 24, weight 97.6 kg, SpO2 94%. PHYSICAL EXAM: Gen:   jaundiced, chronically ill appearing man lying in bed, sleepy, stirs to voice or shoulder shake but not responding to commands Eyes:  icteric ENT: MM dry mouth breathing on RA CV:  RRR no rub Abd: soft, mildly distended Lungs: clear ant GU: deferred Extr:  2+ pitting LE edema Neuro: not following commands, difficult to arouse Skin: no rashes noted   Results for orders placed or performed during the hospital encounter of Jun 02, 2023 (from the past 48 hour(s))  Ammonia     Status: Abnormal   Collection Time: 06-02-23  8:26 PM  Result Value Ref Range   Ammonia 67 (H) 9 - 35 umol/L    Comment: Performed at [facility], [address], [place], [place] [address]  Brain natriuretic peptide     Status: Abnormal   Collection Time: 06-02-23  8:26 PM  Result Value Ref Range   B Natriuretic Peptide 197.7 (H) 0.0 - 100.0 pg/mL    Comment: Performed at [facility], [address]  [place], [place] [address]  Prepare RBC (crossmatch)     Status: None   Collection Time: Jun 02, 2023  8:27 PM  Result Value Ref Range   Order Confirmation      ORDER PROCESSED BY BLOOD BANK Performed at [facility], 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, [place], [place] [address]  HIV Antibody (routine testing w rflx)     Status: None   Collection Time: 05/07/23  4:06 AM  Result Value Ref Range   HIV Screen 4th Generation wRfx Non Reactive Non Reactive    Comment: Performed at [facility], [address], [place], [place] [address]  Basic metabolic panel     Status: Abnormal   Collection Time: 05/07/23  4:06 AM  Result Value Ref Range   Sodium 132 (L) 135 - 145 mmol/L   Potassium 4.4 3.5 - 5.1 mmol/L   Chloride 101 98 - 111 mmol/L   CO2 23 22 - 32 mmol/L   Glucose, Bld 115 (H) 70 - 99 mg/dL    Comment: Glucose reference range applies only to samples taken after fasting for at least 8 hours.   BUN 27 (H) 6 - 20 mg/dL   Creatinine, Ser 6.96 (H) 0.61 - 1.24 mg/dL   Calcium 9.3 8.9 - 29.5 mg/dL   GFR, Estimated 50 (L) >60 mL/min    Comment: (NOTE) Calculated using the CKD-EPI Creatinine Equation (2021)    Anion gap 8 5 - 15    Comment: Performed at [facility], [address], [place], [place] [address]  CBC     Status: Abnormal   Collection Time: 05/07/23  4:06 AM  Result Value Ref Range   WBC 8.8 4.0 - 10.5 K/uL   RBC 1.71 (L) 4.22 - 5.81 MIL/uL   Hemoglobin 7.2 (L) 13.0 - 17.0 g/dL   HCT 24.4 (L) 01.0 - 27.2 %   MCV 125.1 (H) 80.0 - 100.0 fL   MCH 42.1  (H) 26.0 - 34.0 pg   MCHC 33.6 30.0 - 36.0 g/dL   RDW 53.6 (H) 64.4 - 03.4 %   Platelets 71 (L)  150 - 400 K/uL    Comment: Immature Platelet Fraction may be clinically indicated, consider ordering this additional test [id number] REPEATED TO VERIFY    nRBC 0.0 0.0 - 0.2 %    Comment: Performed at [facility], [address] [place], [place] [address]  Magnesium     Status: Abnormal   Collection Time: 05/07/23  4:06 AM  Result Value Ref Range   Magnesium 1.5 (L) 1.7 - 2.4 mg/dL    Comment: Performed at [facility], [address], [place], [place] [address]  Phosphorus     Status: None   Collection Time: 05/07/23  4:06 AM  Result Value Ref Range   Phosphorus 3.5 2.5 - 4.6 mg/dL    Comment: Performed at [facility] Lab, [address] [place], [place] [address]  Protime-INR     Status: Abnormal   Collection Time: 05/07/23  4:06 AM  Result Value Ref Range   Prothrombin Time 32.7 (H) 11.4 - 15.2 seconds   INR 3.2 (H) 0.8 - 1.2    Comment: (NOTE) INR goal varies based on device and disease states. Performed at [facility] Lab, [address], [place], [place] [address]   Hepatic function panel     Status: Abnormal   Collection Time: 05/07/23  4:06 AM  Result Value Ref Range   Total Protein 6.5 6.5 - 8.1 g/dL   Albumin 1.7 (L) 3.5 - 5.0 g/dL   AST 58 (H) 15 - 41 U/L   ALT 31 0 - 44 U/L   Alkaline Phosphatase 133 (H) 38 - 126 U/L   Total Bilirubin 15.3 (H) 0.3 - 1.2 mg/dL   Bilirubin, Direct 6.4 (H) 0.0 - 0.2 mg/dL   Indirect Bilirubin 8.9 (H) 0.3 - 0.9 mg/dL    Comment: Performed at [facility], [address], [place], [place] [address]  Ferritin     Status: Abnormal   Collection Time: 05/07/23  4:06 AM  Result Value Ref Range   Ferritin 609 (H) 24 - 336 ng/mL    Comment: ICTERUS AT THIS LEVEL MAY AFFECT RESULT ICTERUS AT THIS LEVEL MAY AFFECT RESULT Performed at [facility], [address], [place], [place] [address]   Iron and TIBC      Status: Abnormal   Collection Time: 05/07/23  4:06 AM  Result Value Ref Range   Iron 196 (H) 45 - 182 ug/dL   TIBC 952 (L) 841 - 324 ug/dL   Saturation Ratios 84 (H) 17.9 - 39.5 %   UIBC 36 ug/dL    Comment: Performed at [facility], [address] [place], [place] [address]  Reticulocytes     Status: Abnormal   Collection Time: 05/07/23  4:06 AM  Result Value Ref Range   Retic Ct Pct 10.0 (H) 0.4 - 3.1 %   RBC. 1.71 (L) 4.22 - 5.81 MIL/uL   Retic Count, Absolute 171.0 19.0 - 186.0 K/uL   Immature Retic Fract 25.4 (H) 2.3 - 15.9 %    Comment: Performed at [facility], [address], [place], [place] [address]  Transferrin     Status: Abnormal   Collection Time: 05/07/23  4:06 AM  Result Value Ref Range   Transferrin 161 (L) 180 - 329 mg/dL    Comment: Performed at [facility], [address], [place], [place] [address]  Vitamin B12     Status: Abnormal   Collection Time:  05/07/23  4:06 AM  Result Value Ref Range   Vitamin B-12 >7,500 (H) 180 - 914 pg/mL    Comment: ICTERUS AT THIS LEVEL MAY AFFECT RESULT (NOTE) This assay is not validated for testing neonatal or myeloproliferative syndrome specimens for Vitamin B12 levels. Performed at [facility], [address], [place], [place] [address]   Folate     Status: None   Collection Time: 05/07/23  4:06 AM  Result Value Ref Range   Folate 21.2 >5.9 ng/mL    Comment: ICTERUS AT THIS LEVEL MAY AFFECT RESULT Performed at [facility] Lab, [address], [place], [place] [address]   TSH     Status: None   Collection Time: 05/07/23  4:06 AM  Result Value Ref Range   TSH 2.227 0.350 - 4.500 uIU/mL    Comment: Performed by a 3rd Generation assay with a functional sensitivity of <=0.01 uIU/mL. Performed at [facility], [address], [place], [place] [address]   Differential     Status: Abnormal   Collection Time: 05/07/23  4:06 AM  Result Value Ref Range   Neutrophils Relative % 71 %    Neutro Abs 6.4 1.7 - 7.7 K/uL   Lymphocytes Relative 11 %   Lymphs Abs 0.9 0.7 - 4.0 K/uL   Monocytes Relative 13 %   Monocytes Absolute 1.1 (H) 0.1 - 1.0 K/uL   Eosinophils Relative 3 %   Eosinophils Absolute 0.3 0.0 - 0.5 K/uL   Basophils Relative 1 %   Basophils Absolute 0.1 0.0 - 0.1 K/uL   Immature Granulocytes 1 %   Abs Immature Granulocytes 0.06 0.00 - 0.07 K/uL    Comment: Performed at [facility], [address] [place], [place] [address]  Urinalysis, Routine w reflex microscopic -Urine, Clean Catch     Status: Abnormal   Collection Time: 05/07/23  7:41 AM  Result Value Ref Range   Color, Urine AMBER (A) YELLOW    Comment: BIOCHEMICALS MAY BE AFFECTED BY COLOR   APPearance CLEAR CLEAR   Specific Gravity, Urine 1.016 1.005 - 1.030   pH 6.0 5.0 - 8.0   Glucose, UA NEGATIVE NEGATIVE mg/dL   Hgb urine dipstick SMALL (A) NEGATIVE   Bilirubin Urine MODERATE (A) NEGATIVE   Ketones, ur NEGATIVE NEGATIVE mg/dL   Protein, ur NEGATIVE NEGATIVE mg/dL   Nitrite NEGATIVE NEGATIVE   Leukocytes,Ua NEGATIVE NEGATIVE   RBC / HPF 0-5 0 - 5 RBC/hpf   WBC, UA 0-5 0 - 5 WBC/hpf   Bacteria, UA NONE SEEN NONE SEEN   Squamous Epithelial / HPF 0-5 0 - 5 /HPF    Comment: Performed at [facility], [address], [place], [place] [address]  Protein / creatinine ratio, urine     Status: None   Collection Time: 05/07/23  7:41 AM  Result Value Ref Range   Creatinine, Urine 136 mg/dL   Total Protein, Urine 8 mg/dL    Comment: NO NORMAL RANGE ESTABLISHED FOR THIS TEST   Protein Creatinine Ratio 0.06 0.00 - 0.15 mg/mg[Cre]    Comment: Performed at [facility], [address] [place], [place] [address]  Glucose, capillary     Status: Abnormal   Collection Time: 05/08/23  3:01 AM  Result Value Ref Range   Glucose-Capillary 107 (H) 70 - 99 mg/dL    Comment: Glucose reference range applies only to samples taken after fasting for at least 8 hours.  Blood  gas, arterial      Status: Abnormal   Collection Time: 05/08/23  4:50 AM  Result Value Ref Range   pH, Arterial 7.37 7.35 - 7.45   pCO2 arterial 28 (L) 32 - 48 mmHg   pO2, Arterial 76 (L) 83 - 108 mmHg   Bicarbonate 16.0 (L) 20.0 - 28.0 mmol/L   Acid-base deficit 7.6 (H) 0.0 - 2.0 mmol/L   O2 Saturation 96.8 %   Patient temperature 37.6    Collection site REVIEWED BY    Allens test (pass/fail) PASS PASS    Comment: Performed at [facility], [address], [place], [place] [address]  Comprehensive metabolic panel     Status: Abnormal   Collection Time: 05/08/23  7:19 AM  Result Value Ref Range   Sodium 130 (L) 135 - 145 mmol/L   Potassium 4.7 3.5 - 5.1 mmol/L   Chloride 98 98 - 111 mmol/L   CO2 13 (L) 22 - 32 mmol/L   Glucose, Bld 112 (H) 70 - 99 mg/dL    Comment: Glucose reference range applies only to samples taken after fasting for at least 8 hours.   BUN 36 (H) 6 - 20 mg/dL   Creatinine, Ser 6.04 (H) 0.61 - 1.24 mg/dL    Comment: DELTA CHECK NOTED   Calcium 9.0 8.9 - 10.3 mg/dL   Total Protein 6.2 (L) 6.5 - 8.1 g/dL   Albumin 1.7 (L) 3.5 - 5.0 g/dL   AST 66 (H) 15 - 41 U/L   ALT 31 0 - 44 U/L   Alkaline Phosphatase 99 38 - 126 U/L   Total Bilirubin 18.8 (HH) 0.3 - 1.2 mg/dL    Comment: CRITICAL RESULT CALLED TO, READ BACK BY AND VERIFIED WITH AMANDA GRAY RN @0826  10.10.2024 E.AHMED   GFR, Estimated 28 (L) >60 mL/min    Comment: (NOTE) Calculated using the CKD-EPI Creatinine Equation (2021)    Anion gap 19 (H) 5 - 15    Comment: Performed at [facility], [address] [place], [place] [address]  Protime-INR     Status: Abnormal   Collection Time: 05/08/23  7:19 AM  Result Value Ref Range   Prothrombin Time 41.9 (H) 11.4 - 15.2 seconds   INR 4.4 (HH) 0.8 - 1.2    Comment: REPEATED TO VERIFY CRITICAL RESULT CALLED TO, READ BACK BY AND VERIFIED WITH: GRAY,A RN @ [phone] 05/08/23 LEONARD,A (NOTE) INR goal varies based on device and disease states. Performed at [facility], [address], [place], [place] [address]   Lactic acid, plasma     Status: Abnormal   Collection Time: 05/08/23  7:19 AM  Result Value Ref Range   Lactic Acid, Venous >9.0 (HH) 0.5 - 1.9 mmol/L    Comment: CRITICAL RESULT CALLED TO, READ BACK BY AND VERIFIED WITH A.GRAY,RN 0752 05/08/23 CLARK,S Performed at [facility], [address] [place], [place] [address]   Lactic acid, plasma     Status: Abnormal   Collection Time: 05/08/23  8:15 AM  Result Value Ref Range   Lactic Acid, Venous >9.0 (HH) 0.5 - 1.9 mmol/L    Comment: CRITICAL VALUE NOTED. VALUE IS CONSISTENT WITH PREVIOUSLY REPORTED/CALLED VALUE Performed at [facility], [address], [place], [place] [address]   Culture, blood (Routine X 2) w Reflex to ID Panel     Status: None (Preliminary result)   Collection Time: 05/08/23  8:15 AM   Specimen: BLOOD  RIGHT ARM  Result Value Ref Range   Specimen Description BLOOD RIGHT ARM    Special Requests      BOTTLES DRAWN AEROBIC AND ANAEROBIC Blood Culture adequate volume   Culture      NO GROWTH < 12 HOURS Performed at [facility], [address], [place], [place] [address]    Report Status PENDING   Culture, blood (Routine X 2) w Reflex to ID Panel     Status: None (Preliminary result)   Collection Time: 05/08/23  8:15 AM   Specimen: BLOOD RIGHT ARM  Result Value Ref Range   Specimen Description BLOOD RIGHT ARM    Special Requests      BOTTLES DRAWN AEROBIC AND ANAEROBIC Blood Culture adequate volume   Culture      NO GROWTH < 12 HOURS Performed at [facility], [address], [place], [place] [address]    Report Status PENDING   Ammonia     Status: Abnormal   Collection Time: 05/08/23  8:15 AM  Result Value Ref Range   Ammonia 36 (H) 9 - 35 umol/L    Comment: Performed at [facility], [address], [place], [place] [address]  Magnesium     Status: Abnormal   Collection Time: 05/08/23  8:15 AM  Result Value Ref Range    Magnesium 1.5 (L) 1.7 - 2.4 mg/dL    Comment: Performed at [facility], [address], [place], [place] [address]  CBC     Status: Abnormal   Collection Time: 05/08/23  8:15 AM  Result Value Ref Range   WBC 32.6 (H) 4.0 - 10.5 K/uL   RBC 1.64 (L) 4.22 - 5.81 MIL/uL   Hemoglobin 7.0 (L) 13.0 - 17.0 g/dL   HCT 65.7 (L) 84.6 - 96.2 %   MCV 129.3 (H) 80.0 - 100.0 fL   MCH 42.7 (H) 26.0 - 34.0 pg   MCHC 33.0 30.0 - 36.0 g/dL   RDW 95.2 (H) 84.1 - 32.4 %   Platelets 67 (L) 150 - 400 K/uL    Comment: Immature Platelet Fraction may be clinically indicated, consider ordering this additional test [id number]    nRBC 0.1 0.0 - 0.2 %    Comment: Performed at [facility], [address] [place], [place] [address]  Comprehensive metabolic panel     Status: Abnormal   Collection Time: 05/08/23  8:15 AM  Result Value Ref Range   Sodium 130 (L) 135 - 145 mmol/L   Potassium 4.6 3.5 - 5.1 mmol/L   Chloride 98 98 - 111 mmol/L   CO2 14 (L) 22 - 32 mmol/L   Glucose, Bld 118 (H) 70 - 99 mg/dL    Comment: Glucose reference range applies only to samples taken after fasting for at least 8 hours.   BUN 36 (H) 6 - 20 mg/dL   Creatinine, Ser 0.34 (H) 0.61 - 1.24 mg/dL   Calcium 9.0 8.9 - 74.2 mg/dL   Total Protein 6.4 (L) 6.5 - 8.1 g/dL   Albumin 1.7 (L) 3.5 - 5.0 g/dL   AST 62 (H) 15 - 41 U/L   ALT 32 0 - 44 U/L    Comment: RESULT CONFIRMED BY MANUAL DILUTION   Alkaline Phosphatase 94 38 - 126 U/L   Total Bilirubin 19.3 (HH) 0.3 - 1.2 mg/dL    Comment: CRITICAL VALUE NOTED. VALUE IS CONSISTENT WITH PREVIOUSLY REPORTED/CALLED VALUE   GFR, Estimated 26 (L) >60  mL/min    Comment: (NOTE) Calculated using the CKD-EPI Creatinine Equation (2021)    Anion gap 18 (H) 5 - 15    Comment: Performed at [facility] Lab, [address], [place], [place] [address]  Blood gas, arterial     Status: Abnormal   Collection Time: 05/08/23  8:55 AM  Result Value Ref Range   pH, Arterial 7.3 (L)  7.35 - 7.45   pCO2 arterial 27 (L) 32 - 48 mmHg   pO2, Arterial 71 (L) 83 - 108 mmHg   Bicarbonate 13.3 (L) 20.0 - 28.0 mmol/L   Acid-base deficit 11.5 (H) 0.0 - 2.0 mmol/L   O2 Saturation 95.6 %   Patient temperature 37.0    Collection site RIGHT RADIAL    Drawn by [id number]    Allens test (pass/fail) PASS PASS    Comment: Performed at [facility], [address], [place], [place] [address]  Glucose, capillary     Status: Abnormal   Collection Time: 05/08/23  9:33 AM  Result Value Ref Range   Glucose-Capillary 105 (H) 70 - 99 mg/dL    Comment: Glucose reference range applies only to samples taken after fasting for at least 8 hours.  Prepare fresh frozen plasma     Status: None (Preliminary result)   Collection Time: 05/08/23  9:50 AM  Result Value Ref Range   Unit Number [id number]    Blood Component Type THAWED PLASMA    Unit division 00    Status of Unit ISSUED    Transfusion Status      OK TO TRANSFUSE Performed at [facility], [address] [place], [place] [address]    Unit Number [id number]    Blood Component Type THW PLS APHR    Unit division B0    Status of Unit ISSUED    Transfusion Status OK TO TRANSFUSE    Unit Number [id number]    Blood Component Type THW PLS APHR    Unit division B0    Status of Unit ISSUED    Transfusion Status OK TO TRANSFUSE    Unit Number [id number]    Blood Component Type THW PLS APHR    Unit division B0    Status of Unit ISSUED    Transfusion Status OK TO TRANSFUSE   Glucose, capillary     Status: Abnormal   Collection Time: 05/08/23 11:45 AM  Result Value Ref Range   Glucose-Capillary 121 (H) 70 - 99 mg/dL    Comment: Glucose reference range applies only to samples taken after fasting for at least 8 hours.  Glucose, capillary     Status: Abnormal   Collection Time: 05/08/23  3:44 PM  Result Value Ref Range   Glucose-Capillary 139 (H) 70 - 99 mg/dL    Comment: Glucose reference range applies only to  samples taken after fasting for at least 8 hours.  Sodium, urine, random     Status: None   Collection Time: 05/08/23  3:48 PM  Result Value Ref Range   Sodium, Ur 29 mmol/L    Comment: Performed at [facility] Lab, [address], [place], [place] [address]  Protime-INR     Status: Abnormal   Collection Time: 05/08/23  4:10 PM  Result Value Ref Range   Prothrombin Time 29.1 (H) 11.4 - 15.2 seconds   INR 2.7 (H) 0.8 - 1.2    Comment: (NOTE) INR goal varies based on device and disease states. Performed at [facility], [address] [place], [place] [address]   APTT     Status: Abnormal   Collection Time: 05/08/23  4:10 PM  Result Value Ref Range   aPTT 59 (H) 24 - 36 seconds    Comment:        IF BASELINE aPTT IS ELEVATED, SUGGEST PATIENT RISK ASSESSMENT BE USED TO DETERMINE APPROPRIATE ANTICOAGULANT THERAPY. Performed at [facility] Lab, [address] [place], [place] [address]   Glucose, capillary     Status: Abnormal   Collection Time: 05/08/23  7:28 PM  Result Value Ref Range   Glucose-Capillary 141 (H) 70 - 99 mg/dL    Comment: Glucose reference range applies only to samples taken after fasting for at least 8 hours.    CT CHEST ABDOMEN PELVIS W CONTRAST  Result Date: 05/08/2023 CLINICAL DATA:  Inpatient.  Sepsis.  Abnormal labs. EXAM: CT CHEST, ABDOMEN, AND PELVIS WITH CONTRAST TECHNIQUE: Multidetector CT imaging of the chest, abdomen and pelvis was performed following the standard protocol during bolus administration of intravenous contrast. RADIATION DOSE REDUCTION: This exam was performed according to the departmental dose-optimization program which includes automated exposure control, adjustment of the mA and/or kV according to patient size and/or use of iterative reconstruction technique. CONTRAST:  75mL OMNIPAQUE IOHEXOL 350 MG/ML SOLN COMPARISON:  1624 coronary CT. 04/01/2021 pelvis CT. 05/01/2018 CT abdomen/pelvis. 10/01/2008 chest CT. FINDINGS: CT  CHEST FINDINGS Cardiovascular: Borderline mild cardiomegaly. No significant pericardial effusion/thickening. Mildly atherosclerotic nonaneurysmal thoracic aorta. Normal caliber pulmonary arteries. No central pulmonary emboli. Mediastinum/Nodes: No significant thyroid nodules. Large lower paraesophageal varices. Otherwise normal esophagus. No pathologically enlarged axillary, mediastinal or hilar lymph nodes. Lungs/Pleura: No pneumothorax. Small left and trace right layering pleural effusions. Moderate left and mild right passive lower lobe atelectasis. No lung masses or significant pulmonary nodules in the aerated portions of the lungs. Musculoskeletal: No aggressive appearing focal osseous lesions. Mild thoracic spondylosis. Mild anasarca. Moderate symmetric bilateral gynecomastia. CT ABDOMEN PELVIS FINDINGS Hepatobiliary: Atrophic liver with diffusely irregular liver surface, compatible with cirrhosis. No liver masses. No radiopaque cholelithiasis. Mild diffuse gallbladder wall thickening. No gallbladder distention. No biliary ductal dilatation. Pancreas: Normal, with no mass or duct dilation. Spleen: Mild splenomegaly. Craniocaudal splenic length 14.2 cm. No splenic masses. Adrenals/Urinary Tract: Normal adrenals. No renal masses. No hydronephrosis. Normal bladder. Stomach/Bowel: Small hiatal hernia. Otherwise normal nondistended stomach. Normal caliber small bowel with no small bowel wall thickening. Appendix not discretely visualized. Normal large bowel with no diverticulosis, large bowel wall thickening or pericolonic fat stranding. Vascular/Lymphatic: Atherosclerotic nonaneurysmal abdominal aorta. Patent portal, splenic, hepatic and renal veins. Large upper retroperitoneal portosystemic varices between the portal and left renal veins. No pathologically enlarged lymph nodes in the abdomen or pelvis. Reproductive: Normal size prostate. Other: No pneumoperitoneum. Small volume ascites. Ill-defined presacral  space fluid and fat stranding. Mild anasarca. Musculoskeletal: No aggressive appearing focal osseous lesions. Mild lumbar spondylosis. IMPRESSION: 1. Cirrhosis. No liver masses. 2. Mild splenomegaly. 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 3. Evidence of fluid third spacing. Mild anasarca. Small volume ascites. Small left and trace right layering pleural effusions. 4. Nonspecific mild gallbladder wall thickening, favor noninflammatory edema. 5. Borderline mild cardiomegaly. 6. Small hiatal hernia. 7.  Aortic Atherosclerosis (ICD10-I70.0). Electronically Signed   By: Delbert Phenix M.D.   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 EXAM: ABDOMEN - 1 VIEW COMPARISON:  Head CT 0040 hours today. CT Abdomen and Pelvis 04/01/2021. FINDINGS: Three portable AP supine views of the abdomen and pelvis at 0430 hours. Occasional EKG leads. No metallic or radiopaque foreign body identified in the abdomen or pelvis. Patchy nonspecific lung base opacity. Paucity of bowel gas. No acute osseous abnormality identified. IMPRESSION: 1. No retained metal foreign body in the abdomen or pelvis. 2. Nonobstructed bowel-gas pattern. Nonspecific patchy lung base opacity. Electronically Signed   By: Odessa Fleming M.D.   On: 05/08/2023 05:11   CT HEAD WO CONTRAST ( )  Result Date: 05/08/2023 CLINICAL DATA:  Initial evaluation for mental status change, worsening headache. EXAM: CT HEAD WITHOUT CONTRAST TECHNIQUE: Contiguous axial images were obtained from the base of the skull through the vertex without intravenous contrast. RADIATION DOSE REDUCTION: This exam was performed according to the departmental dose-optimization program which includes automated exposure control, adjustment of the mA and/or kV according to patient size and/or use of iterative reconstruction technique. COMPARISON:  Prior CT from 10/01/2008.  FINDINGS: Brain: Cerebral volume within normal limits. No acute intracranial hemorrhage. No acute large vessel territory infarct. No mass lesion. Ventricles normal size without hydrocephalus. 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 These measure up to 3-4 mm in maximal thickness. No significant mass effect or midline shift. Vascular: No abnormal hyperdense vessel. Calcified atherosclerosis present about the skull base. Skull: Scalp soft tissues demonstrate no acute finding. Calvarium intact. Sinuses/Orbits: Globes and orbital soft tissues within normal limits. Mild mucosal thickening present about the ethmoidal air cells and maxillary sinuses. Paranasal sinuses are otherwise clear. No mastoid effusion. Other: None. IMPRESSION: 1. 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 These measure up to 3-4 mm in maximal thickness. No significant mass effect or midline shift. 2. No other acute intracranial abnormality. Electronically Signed   By: Rise Mu M.D.   On: 05/08/2023 02:20   ECHOCARDIOGRAM COMPLETE  Result Date: 05/07/2023    ECHOCARDIOGRAM REPORT   Patient Name:   VALLEN LINTHICUM Date of Exam: 05/07/2023 Medical Rec #:  [id number]    Height:       69.0 in Accession #:    [id number]   Weight:       208.0 lb Date of Birth:  1966/03/04    BSA:          2.101 m Patient Age:    57 years     BP:           126/59 mmHg Patient Gender: M            HR:           110 bpm. Exam Location:  Inpatient Procedure: 2D Echo, Cardiac Doppler and Color Doppler Indications:    CHF  History:        Patient has prior history of Echocardiogram examinations, most                 recent 09/02/2022. Signs/Symptoms:Chest Pain.  Sonographer:    Melton Krebs RDCS, FE, PE Referring Phys: [id number] Dolly Rias IMPRESSIONS  1. Left ventricular ejection fraction, by estimation, is  65 to 70%. The left ventricle has normal function. The left ventricle has no regional wall motion abnormalities. There is mild left ventricular hypertrophy. Left ventricular diastolic parameters were normal.  2. Right ventricular systolic function is normal. The right ventricular size is normal. There is moderately elevated pulmonary artery systolic pressure.  3. Left atrial  size was mildly dilated.  4. Right atrial size was mild to moderately dilated.  5. The mitral valve is grossly normal. Mild mitral valve regurgitation. No evidence of mitral stenosis.  6. The aortic valve is tricuspid. Aortic valve regurgitation is not visualized. Aortic valve sclerosis is present, with no evidence of aortic valve stenosis.  7. The inferior vena cava is normal in size with greater than 50% respiratory variability, suggesting right atrial pressure of 3 mmHg. Comparison(s): No significant change from prior study. Conclusion(s)/Recommendation(s): Otherwise normal echocardiogram, with minor abnormalities described in the report. FINDINGS  Left Ventricle: Left ventricular ejection fraction, by estimation, is 65 to 70%. The left ventricle has normal function. The left ventricle has no regional wall motion abnormalities. The left ventricular internal cavity size was normal in size. There is  mild left ventricular hypertrophy. Left ventricular diastolic parameters were normal. Right Ventricle: The right ventricular size is normal. Right vetricular wall thickness was not well visualized. Right ventricular systolic function is normal. There is moderately elevated pulmonary artery systolic pressure. The tricuspid regurgitant velocity is 3.63 m/s, and with an assumed right atrial pressure of 3 mmHg, the estimated right ventricular systolic pressure is 55.7 mmHg. Left Atrium: Left atrial size was mildly dilated. Right Atrium: Right atrial size was mild to moderately dilated. Pericardium: There is no evidence of pericardial effusion. Mitral  Valve: The mitral valve is grossly normal. Mild mitral valve regurgitation. No evidence of mitral valve stenosis. Tricuspid Valve: The tricuspid valve is normal in structure. Tricuspid valve regurgitation is trivial. No evidence of tricuspid stenosis. Aortic Valve: The aortic valve is tricuspid. Aortic valve regurgitation is not visualized. Aortic valve sclerosis is present, with no evidence of aortic valve stenosis. Aortic valve mean gradient measures 12.0 mmHg. Aortic valve peak gradient measures 20.6 mmHg. Aortic valve area, by VTI measures 3.06 cm. Pulmonic Valve: The pulmonic valve was not well visualized. Pulmonic valve regurgitation is not visualized. No evidence of pulmonic stenosis. Aorta: The aortic root and ascending aorta are structurally normal, with no evidence of dilitation. Venous: The inferior vena cava is normal in size with greater than 50% respiratory variability, suggesting right atrial pressure of 3 mmHg. IAS/Shunts: The atrial septum is grossly normal.  LEFT VENTRICLE PLAX 2D LVIDd:         5.80 cm   Diastology LVIDs:         3.80 cm   LV e' medial:    9.57 cm/s LV PW:         1.20 cm   LV E/e' medial:  14.8 LV IVS:        0.90 cm   LV e' lateral:   10.30 cm/s LVOT diam:     2.20 cm   LV E/e' lateral: 13.8 LV SV:         105 LV SV Index:   50 LVOT Area:     3.80 cm  RIGHT VENTRICLE RV S prime:     22.90 cm/s TAPSE (M-mode): 3.0 cm LEFT ATRIUM             Index        RIGHT ATRIUM           Index LA Vol (A2C):   76.7 ml 36.51 ml/m  RA Area:     21.50 cm LA Vol (A4C):   51.7 ml 24.61 ml/m  RA Volume:   66.80 ml  31.80 ml/m LA Biplane Vol: 64.1 ml 30.51 ml/m  AORTIC VALVE AV Area (Vmax):  2.90 cm AV Area (Vmean):   2.73 cm AV Area (VTI):     3.06 cm AV Vmax:           227.00 cm/s AV Vmean:          163.000 cm/s AV VTI:            0.344 m AV Peak Grad:      20.6 mmHg AV Mean Grad:      12.0 mmHg LVOT Vmax:         173.00 cm/s LVOT Vmean:        117.000 cm/s LVOT VTI:          0.277 m  LVOT/AV VTI ratio: 0.81  AORTA Ao Root diam: 3.30 cm Ao Asc diam:  3.00 cm MITRAL VALVE                TRICUSPID VALVE MV Area (PHT): 4.15 cm     TR Peak grad:   52.7 mmHg MV E velocity: 142.00 cm/s  TR Vmax:        363.00 cm/s MV A velocity: 169.00 cm/s MV E/A ratio:  0.84         SHUNTS                             Systemic VTI:  0.28 m                             Systemic Diam: 2.20 cm Jodelle Red MD Electronically signed by Jodelle Red MD Signature Date/Time: 05/07/2023/8:59:24 PM    Final    DG CHEST PORT 1 VIEW  Result Date: 05/03/2023 CLINICAL DATA:  Shortness of breath. EXAM: PORTABLE CHEST 1 VIEW COMPARISON:  10/01/2008. FINDINGS: Heart is enlarged and the mediastinal contour is within normal limits. Lung volumes are low with mild airspace disease at the left lung base. No effusion or pneumothorax. No acute osseous abnormality. IMPRESSION: Mild atelectasis or infiltrate at the left lung base. Electronically Signed   By: Thornell Sartorius M.D.   On: 05/25/2023 22:29    Assessment/Plan **AMS secondary to decompensated cirrhosis: lactulose and precedex per primary  **AKI on CKD 3:  appears to have some mild CKD in past 6 mo now with AKI in setting of decompensated cirrhosis +  contrast exposure, markedly elevated bilirubin.  No obstruction on imaging, UA bland, urine sodium not low.  Suspect multifactorial as such cannot conclude HRS.  Would continue with albumin support for now but d/c isotonic fluid given already overloaded. Hold diuretics unless clinically indicated for now.   Cont strict I/Os.  No indications for dialysis.   **Hyponatremia, hypovolemic: mild, follow.   **AGMA:  Lactate 10, pH 7.3. In setting of AKI, liver disease.  has rec'd supplemental IV bicarb, follow.    **Anemia:  s/p 1u pRBC, per primary. No indications for ESA.   **Decompensated etOH cirrhosis:  no EtOH for 5 wks per chart.  Followed by hepatology outpt.   Will follow, reach out with  concerns.  Tyler Pita 05/08/2023, 8:11 PM

## 2023-05-08 NOTE — Progress Notes (Signed)
ABG sample collected and sent to Lab.

## 2023-05-08 NOTE — Progress Notes (Signed)
MEWS Progress Note  Patient Details Name: Charles Stone MRN: [id number] DOB: [birth year] Today's Date: 05/08/2023   MEWS Flowsheet Documentation:  Assess: MEWS Score Temp: 99.1 F (37.3 C) BP: (!) 123/51 MAP (mmHg): 71 Pulse Rate: (!) 133 ECG Heart Rate: (!) 108 Resp: (!) 34 Level of Consciousness: Responds to Pain SpO2: 93 % O2 Device: Room Air Patient Activity (if Appropriate): In bed Assess: MEWS Score MEWS Temp: 0 MEWS Systolic: 0 MEWS Pulse: 3 MEWS RR: 2 MEWS LOC: 2 MEWS Score: 7 MEWS Score Color: Red Assess: SIRS CRITERIA SIRS Temperature : 0 SIRS Respirations : 1 SIRS Pulse: 1 SIRS WBC: 0 SIRS Score Sum : 2 Assess: if the MEWS score is Yellow or Red Were vital signs accurate and taken at a resting state?: Yes Does the patient meet 2 or more of the SIRS criteria?: Yes Does the patient have a confirmed or suspected source of infection?: No MEWS guidelines implemented : No, previously red, continue vital signs every 4 hours        Karolee Ohs 05/08/2023, 6:06 AM

## 2023-05-08 NOTE — Progress Notes (Signed)
Transported patient to and from CT.

## 2023-05-08 NOTE — Progress Notes (Signed)
PHARMACY - PHYSICIAN COMMUNICATION CRITICAL VALUE ALERT - BLOOD CULTURE IDENTIFICATION (BCID)  Charles Stone is an 57 y.o. male who presented to [facility] on 05/06/2023 with a chief complaint of AMS, due to hepatic encephalopathy  Assessment:  2/4 bottles (aerobic) from 2/2 blood cultures positive for E. Coli on BCID report, no resistance detected  Name of physician (or Provider) Contacted: Joneen Roach, NP  Current antibiotics: Ceftriaxone 2g IV q24h  Changes to prescribed antibiotics recommended:  Patient is on recommended antibiotics - No changes needed  Results for orders placed or performed during the hospital encounter of 05/06/23  Blood Culture ID Panel (Reflexed) (Collected: 05/08/2023  8:15 AM)  Result Value Ref Range   Enterococcus faecalis NOT DETECTED NOT DETECTED   Enterococcus Faecium NOT DETECTED NOT DETECTED   Listeria monocytogenes NOT DETECTED NOT DETECTED   Staphylococcus species NOT DETECTED NOT DETECTED   Staphylococcus aureus (BCID) NOT DETECTED NOT DETECTED   Staphylococcus epidermidis NOT DETECTED NOT DETECTED   Staphylococcus lugdunensis NOT DETECTED NOT DETECTED   Streptococcus species NOT DETECTED NOT DETECTED   Streptococcus agalactiae NOT DETECTED NOT DETECTED   Streptococcus pneumoniae NOT DETECTED NOT DETECTED   Streptococcus pyogenes NOT DETECTED NOT DETECTED   A.calcoaceticus-baumannii NOT DETECTED NOT DETECTED   Bacteroides fragilis NOT DETECTED NOT DETECTED   Enterobacterales DETECTED (A) NOT DETECTED   Enterobacter cloacae complex NOT DETECTED NOT DETECTED   Escherichia coli DETECTED (A) NOT DETECTED   Klebsiella aerogenes NOT DETECTED NOT DETECTED   Klebsiella oxytoca NOT DETECTED NOT DETECTED   Klebsiella pneumoniae NOT DETECTED NOT DETECTED   Proteus species NOT DETECTED NOT DETECTED   Salmonella species NOT DETECTED NOT DETECTED   Serratia marcescens NOT DETECTED NOT DETECTED   Haemophilus influenzae NOT DETECTED NOT DETECTED   Neisseria  meningitidis NOT DETECTED NOT DETECTED   Pseudomonas aeruginosa NOT DETECTED NOT DETECTED   Stenotrophomonas maltophilia NOT DETECTED NOT DETECTED   Candida albicans NOT DETECTED NOT DETECTED   Candida auris NOT DETECTED NOT DETECTED   Candida glabrata NOT DETECTED NOT DETECTED   Candida krusei NOT DETECTED NOT DETECTED   Candida parapsilosis NOT DETECTED NOT DETECTED   Candida tropicalis NOT DETECTED NOT DETECTED   Cryptococcus neoformans/gattii NOT DETECTED NOT DETECTED   CTX-M ESBL NOT DETECTED NOT DETECTED   Carbapenem resistance IMP NOT DETECTED NOT DETECTED   Carbapenem resistance KPC NOT DETECTED NOT DETECTED   Carbapenem resistance NDM NOT DETECTED NOT DETECTED   Carbapenem resist OXA 48 LIKE NOT DETECTED NOT DETECTED   Carbapenem resistance VIM NOT DETECTED NOT DETECTED    Wilburn Cornelia, PharmD, BCPS Clinical Pharmacist 05/08/2023 10:20 PM   Please refer to AMION for pharmacy phone number

## 2023-05-08 NOTE — Progress Notes (Signed)
PT Cancellation Note  Patient Details Name: Charles Stone MRN: [id number] DOB: 22-Nov-1965   Cancelled Treatment:    Reason Eval/Treat Not Completed: Medical issues which prohibited therapy (Pt had a rapid response called this AM. Per RN, pt is transferring to 4M and requests PT to hold at this time.)   Gladys Damme 05/08/2023, 8:56 AM

## 2023-05-09 ENCOUNTER — Inpatient Hospital Stay (HOSPITAL_COMMUNITY): Payer: BC Managed Care – PPO

## 2023-05-09 DIAGNOSIS — K7290 Hepatic failure, unspecified without coma: Secondary | ICD-10-CM | POA: Diagnosis not present

## 2023-05-09 DIAGNOSIS — K652 Spontaneous bacterial peritonitis: Secondary | ICD-10-CM

## 2023-05-09 DIAGNOSIS — D689 Coagulation defect, unspecified: Secondary | ICD-10-CM | POA: Diagnosis not present

## 2023-05-09 DIAGNOSIS — K7682 Hepatic encephalopathy: Secondary | ICD-10-CM | POA: Diagnosis not present

## 2023-05-09 DIAGNOSIS — D649 Anemia, unspecified: Secondary | ICD-10-CM | POA: Diagnosis not present

## 2023-05-09 DIAGNOSIS — K7031 Alcoholic cirrhosis of liver with ascites: Secondary | ICD-10-CM

## 2023-05-09 LAB — PREPARE FRESH FROZEN PLASMA: Unit division: 0

## 2023-05-09 LAB — GLUCOSE, CAPILLARY
Glucose-Capillary: 101 mg/dL — ABNORMAL HIGH (ref 70–99)
Glucose-Capillary: 104 mg/dL — ABNORMAL HIGH (ref 70–99)
Glucose-Capillary: 106 mg/dL — ABNORMAL HIGH (ref 70–99)
Glucose-Capillary: 133 mg/dL — ABNORMAL HIGH (ref 70–99)
Glucose-Capillary: 135 mg/dL — ABNORMAL HIGH (ref 70–99)
Glucose-Capillary: 152 mg/dL — ABNORMAL HIGH (ref 70–99)

## 2023-05-09 LAB — CBC WITH DIFFERENTIAL/PLATELET
Abs Immature Granulocytes: 0.16 10*3/uL — ABNORMAL HIGH (ref 0.00–0.07)
Basophils Absolute: 0 10*3/uL (ref 0.0–0.1)
Basophils Relative: 0 %
Eosinophils Absolute: 0 10*3/uL (ref 0.0–0.5)
Eosinophils Relative: 0 %
HCT: 18 % — ABNORMAL LOW (ref 39.0–52.0)
Hemoglobin: 6.1 g/dL — CL (ref 13.0–17.0)
Immature Granulocytes: 1 %
Lymphocytes Relative: 4 %
Lymphs Abs: 0.9 10*3/uL (ref 0.7–4.0)
MCH: 43.9 pg — ABNORMAL HIGH (ref 26.0–34.0)
MCHC: 33.9 g/dL (ref 30.0–36.0)
MCV: 129.5 fL — ABNORMAL HIGH (ref 80.0–100.0)
Monocytes Absolute: 1.7 10*3/uL — ABNORMAL HIGH (ref 0.1–1.0)
Monocytes Relative: 7 %
Neutro Abs: 21.1 10*3/uL — ABNORMAL HIGH (ref 1.7–7.7)
Neutrophils Relative %: 88 %
Platelets: 61 10*3/uL — ABNORMAL LOW (ref 150–400)
RBC: 1.39 MIL/uL — ABNORMAL LOW (ref 4.22–5.81)
RDW: 21.2 % — ABNORMAL HIGH (ref 11.5–15.5)
WBC: 23.9 10*3/uL — ABNORMAL HIGH (ref 4.0–10.5)
nRBC: 0.1 % (ref 0.0–0.2)

## 2023-05-09 LAB — BPAM FFP
Blood Product Expiration Date: 202410102359
Blood Product Expiration Date: 202410102359
Blood Product Expiration Date: 202410112359
Blood Product Expiration Date: 202410142359
ISSUE DATE / TIME: 202410101208
ISSUE DATE / TIME: 202410101354
ISSUE DATE / TIME: 202410101438
ISSUE DATE / TIME: 202410101520
Unit Type and Rh: 600
Unit Type and Rh: 6200
Unit Type and Rh: 6200
Unit Type and Rh: 6200

## 2023-05-09 LAB — COMPREHENSIVE METABOLIC PANEL
ALT: 31 U/L (ref 0–44)
AST: 54 U/L — ABNORMAL HIGH (ref 15–41)
Albumin: 2.9 g/dL — ABNORMAL LOW (ref 3.5–5.0)
Alkaline Phosphatase: 69 U/L (ref 38–126)
Anion gap: 14 (ref 5–15)
BUN: 46 mg/dL — ABNORMAL HIGH (ref 6–20)
CO2: 19 mmol/L — ABNORMAL LOW (ref 22–32)
Calcium: 9.4 mg/dL (ref 8.9–10.3)
Chloride: 101 mmol/L (ref 98–111)
Creatinine, Ser: 2.92 mg/dL — ABNORMAL HIGH (ref 0.61–1.24)
GFR, Estimated: 24 mL/min — ABNORMAL LOW (ref >60)
Glucose, Bld: 135 mg/dL — ABNORMAL HIGH (ref 70–99)
Potassium: 5.1 mmol/L (ref 3.5–5.1)
Sodium: 134 mmol/L — ABNORMAL LOW (ref 135–145)
Total Bilirubin: 22.9 mg/dL (ref 0.3–1.2)
Total Protein: 6.6 g/dL (ref 6.5–8.1)

## 2023-05-09 LAB — PREPARE RBC (CROSSMATCH)

## 2023-05-09 LAB — HEMOGLOBIN AND HEMATOCRIT, BLOOD
HCT: 20 % — ABNORMAL LOW (ref 39.0–52.0)
HCT: 22.9 % — ABNORMAL LOW (ref 39.0–52.0)
HCT: 22.5 % — ABNORMAL LOW (ref 39.0–52.0)
Hemoglobin: 6.8 g/dL — CL (ref 13.0–17.0)
Hemoglobin: 7.7 g/dL — ABNORMAL LOW (ref 13.0–17.0)
Hemoglobin: 7.9 g/dL — ABNORMAL LOW (ref 13.0–17.0)

## 2023-05-09 LAB — AMMONIA: Ammonia: 25 umol/L (ref 9–35)

## 2023-05-09 LAB — PROTIME-INR
INR: 4 — ABNORMAL HIGH (ref 0.8–1.2)
INR: 4.2 (ref 0.8–1.2)
Prothrombin Time: 39 s — ABNORMAL HIGH (ref 11.4–15.2)
Prothrombin Time: 40.8 s — ABNORMAL HIGH (ref 11.4–15.2)

## 2023-05-09 LAB — MAGNESIUM
Magnesium: 2.5 mg/dL — ABNORMAL HIGH (ref 1.7–2.4)
Magnesium: 2.7 mg/dL — ABNORMAL HIGH (ref 1.7–2.4)

## 2023-05-09 LAB — APTT: aPTT: 64 s — ABNORMAL HIGH (ref 24–36)

## 2023-05-09 LAB — LACTIC ACID, PLASMA: Lactic Acid, Venous: 4.5 mmol/L (ref 0.5–1.9)

## 2023-05-09 LAB — PHOSPHORUS
Phosphorus: 6.5 mg/dL — ABNORMAL HIGH (ref 2.5–4.6)
Phosphorus: 6.8 mg/dL — ABNORMAL HIGH (ref 2.5–4.6)

## 2023-05-09 MED ORDER — OSMOLITE 1.5 CAL PO LIQD
1000.0000 mL | ORAL | Status: DC
  Administered 2023-05-09 – 2023-05-10 (×3): 1000 mL

## 2023-05-09 MED ORDER — SODIUM CHLORIDE 0.9% IV SOLUTION: Freq: Once | INTRAVENOUS | Status: AC

## 2023-05-09 MED ORDER — SODIUM CHLORIDE 0.9% IV SOLUTION: Freq: Once | INTRAVENOUS | Status: DC

## 2023-05-09 MED ORDER — ADULT MULTIVITAMIN W/MINERALS CH
1.0000 | ORAL_TABLET | Freq: Every day | ORAL | Status: DC
  Administered 2023-05-09 – 2023-05-10 (×2): 1
  Filled 2023-05-09 (×2): qty 1

## 2023-05-09 MED ORDER — ALBUMIN HUMAN 25 % IV SOLN
25.0000 g | Freq: Four times a day (QID) | INTRAVENOUS | Status: AC
  Administered 2023-05-09 – 2023-05-10 (×4): 25 g via INTRAVENOUS
  Filled 2023-05-09 (×4): qty 100

## 2023-05-09 MED ORDER — PROSOURCE TF20 ENFIT COMPATIBL EN LIQD
60.0000 mL | Freq: Every day | ENTERAL | Status: DC
  Administered 2023-05-09 – 2023-05-10 (×2): 60 mL
  Filled 2023-05-09 (×2): qty 60

## 2023-05-09 MED ORDER — LACTULOSE 10 GM/15ML PO SOLN
20.0000 g | Freq: Two times a day (BID) | ORAL | Status: DC
  Administered 2023-05-09 – 2023-05-10 (×2): 20 g via ORAL
  Filled 2023-05-09 (×2): qty 30

## 2023-05-09 MED ORDER — VITAMIN K1 10 MG/ML IJ SOLN
10.0000 mg | Freq: Once | INTRAVENOUS | Status: AC
  Administered 2023-05-09: 10 mg via INTRAVENOUS
  Filled 2023-05-09: qty 1

## 2023-05-09 MED ORDER — DEXMEDETOMIDINE HCL IN NACL 400 MCG/100ML IV SOLN
0.0000 ug/kg/h | INTRAVENOUS | Status: DC
  Administered 2023-05-09: 0.5 ug/kg/h via INTRAVENOUS
  Administered 2023-05-09: 0.4 ug/kg/h via INTRAVENOUS
  Administered 2023-05-10: 0.5 ug/kg/h via INTRAVENOUS
  Filled 2023-05-09: qty 200

## 2023-05-09 MED ORDER — SODIUM CHLORIDE 0.9 % IV SOLN
2.0000 g | INTRAVENOUS | Status: DC
  Administered 2023-05-10: 2 g via INTRAVENOUS
  Filled 2023-05-09: qty 20

## 2023-05-09 NOTE — Progress Notes (Signed)
NAME:  Charles Stone, MRN:  [id number], DOB:  10-25-1965, LOS: 2 ADMISSION DATE:  05/28/2023, CONSULTATION DATE:  10/10 REFERRING MD:  Jacqulyn Bath, CHIEF COMPLAINT:  Hepatic encephalopathy   History of Present Illness:  Charles Stone is a 57yo M w/ hx of cirrhosis, alcohol use disorder, CKD stage3, anemia, thrombocytopenia who was admitted 10/9 for 2wks of confusion, found to have hepatic encephalopathy. Pt was being treated w/ lactulose,  but then became acutely obtunded ON and was sent to ICU.  Pertinent  Medical History  Decompensated liver cirrhosis, hx of alcohol use disorder (last drink August), CKD3, anemia, thrombocytopenia  Significant Hospital Events: Including procedures, antibiotic start and stop dates in addition to other pertinent events   10/9 Admitted for hepatic encephalopathy to [facility] 10/10 Acutely obtunded, transfer to ICU 10/11 continues to intermittently require vasopressor support, blood cultures positive for E. coli bacteremia.  Very obtunded this a.m.  Interim History / Subjective:  Required 1 unit of PRBC overnight for hemoglobin 6.8 INR remains elevated at 4  Objective   Blood pressure (!) 112/57, pulse 92, temperature 98 F (36.7 C), temperature source Axillary, resp. rate (!) 31, weight 99.2 kg, SpO2 95%.        Intake/Output Summary (Last 24 hours) at 05/09/2023 4098 Last data filed at 05/09/2023 0700 Gross per 24 hour  Intake 2868.42 ml  Output 2975 ml  Net -106.58 ml   Filed Weights   05/08/23 0900 05/09/23 0500  Weight: 97.6 kg 99.2 kg    Examination: General: Acute on chronic ill-appearing middle-age male lying in bed in no acute distress HEENT: [place]/AT, mucous membranes dry, sclera icteric Neuro: Unresponsive/obtunded on low-dose Precedex drip CV: s1s2 regular rate and rhythm, no murmur, rubs, or gallops,  PULM: Clear to auscultation bilaterally, shallow respirations no increased work of breathing, no added breath sounds oxygen saturations  borderline 93% on room air GI: soft, bowel sounds active in all 4 quadrants, non-tender, non-distended Extremities: warm/dry, no edema  Skin: no rashes or lesions  Resolved Hospital Problem list     Assessment & Plan:   Hepatic Encephalopathy Chronic Subdural Hygromas  -CT head showed chronic subdural hydromas w/o significant mass effect. P: Maintain neuro protective measures Continue Lactulose as below Nutrition and bowel regiment  Seizure precautions  Aspirations precautions  Stop Precedex drip as patient is obtunded Close monitoring of airway and ICU  Decompensated Cirrhosis 2/2 Alcohol  Daily alcohol use  -MELD-Na: 44  P: Consult GI  Consider steroids  Continue Lactulose enemas until central access obtained    At risk for respiratory decompensation -Attended this a.m. only question ability to continue to protect airway.  P: Stop Precedex drip Minimize sedation Aspiration precautions At risk for intubation, see goals of care below for further detail  E.coli bacteriemia likely secondary to SBP P: Continue Ceftriaxone  Follow culture  Diagnostic paracentesis  Follow CBC and fever curve  Pressors as needed for MAP < 65 Minimize sedation   Acute kidney injury superimposed on CKD stage 3 Concern for Cardiorenal Syndrome -Acute rise in Cr from 1.59 to 2.72. Suspect poor profusion 2/2 cardiorenal and.or contrast induced nephropathy  P: Nephrology following  Follow renal function  Monitor urine output Trend Bmet Avoid nephrotoxins Ensure adequate renal perfusion  Received scheduled albumin 10/10 with no improvement seen in renal function  Consider adding octreotide  Chronic Anemia  Chronic Thrombocytopenia -S/p 3u pRBC and 4 FFP thus far  Elevated PT/INR -Likely 2/2 hepatic dysfunction.  P: Continue IV  Protonix and consider octreotide as below  Continue to trend CBC  Transfuse per protocol  Hgb goal > 7 Repeat INR  Vitamin K now   Anion Gap  Metabolic Acidosis  Lactic Acidosis -ABG c/w metabolic acidosis ph 7.3. Anion gap 18. Lactic acid >9 in the setting of bacteremia  P: Trend BMP and lactic   Hyponatremia  P: Trend BMP  Goals of care  Adrain has 3 children all of which were present this a.m. for GOC discussion.  They all state that Laurencio verbalized to them on this admission that he would not want any life preserving interventions if admit his quality of life was altered.  They state that Angelina stated "I would not want to live on machine".    At this time family would like to continue aggressive interventions including hemodialysis if indicated.  Also would like to continue full CODE STATUS with agreement to intubation if outcome would lead to good quality of life.   Best Practice (right click and "Reselect all SmartList Selections" daily)   Diet/type: NPO DVT prophylaxis: SCD GI prophylaxis: PPI Lines: PIV Foley:  N/A Code Status:  full code Last date of multidisciplinary goals of care discussion spoke w/ family at bedside [10/10]  CRITICAL CARE Performed by: Dorice Stiggers D. Harris  Total critical care time: 42 minutes  Critical care time was exclusive of separately billable procedures and treating other patients.  Critical care was necessary to treat or prevent imminent or life-threatening deterioration.  Critical care was time spent personally by me on the following activities: development of treatment plan with patient and/or surrogate as well as nursing, discussions with consultants, evaluation of patient's response to treatment, examination of patient, obtaining history from patient or surrogate, ordering and performing treatments and interventions, ordering and review of laboratory studies, ordering and review of radiographic studies, pulse oximetry and re-evaluation of patient's condition.  Obert Espindola D. Harris, NP-C [facility] Personal contact information can be found on Amion  If no  contact or response made please call [phone] 05/09/2023, 8:57 AM

## 2023-05-09 NOTE — Progress Notes (Signed)
eLink Physician-Brief Progress Note Patient Name: Charles Stone DOB: [birth year] MRN: [id number]   Date of Service  05/09/2023  HPI/Events of Note  Pt INR critical 4.2 (was 4.0), got Vit K, FFP x 4 and PRBC x 2 previously.  Camera: On nasal o2, NG tube. VS stable.   Cirrhosis, GI bleed. Cirrhosis. Stable now per GI. Flexiseal: on lactulose- yellow .   Stable anemia , Hg 7.9   eICU Interventions  2 FFP stat for now.   Discussed with RN     Intervention Category Intermediate Interventions: Other:  Ranee Gosselin 05/09/2023, 8:21 PM

## 2023-05-09 NOTE — Plan of Care (Signed)

## 2023-05-09 NOTE — Consult Note (Signed)
[place] [facility] Consult Note   History BERLEY GAMBRELL MRN # [id number]  Date of Admission: 05/06/2023 Date of Consultation: 05/09/2023 Referring physician: Dr. Patrici Ranks, MD Primary Care Provider: April Manson, NP Primary Gastroenterologist: Dr Kayren Eaves and Concepcion Elk at [facility]   Reason for Consultation/Chief Complaint: Decompensated alcohol-related cirrhosis  Subjective  HPI:  This is a 57 year old man with known alcohol-related cirrhosis, reportedly abstinent approximately 5 weeks according to his family, admitted here 3 days ago with profound mental status change and agitation with obtundation, worsening of his liver function from baseline with coagulopathy as well as acute renal failure. His 3 children at the bedside, and the patient cannot give any additional history or review of systems due to his stuporous condition right now.  Previous history largely obtained from the chart. While he remains agitated and in restraints, his family says that earlier today he was somewhat more interactive than yesterday.  Although he became agitated and confused at times and required Precedex earlier today, and has been turned off for about the last half hour or so. He is found to have E. coli bacteremia and is on antibiotics for that.  Unfortunately his renal function and liver function continued to decline in the last 24 to 48 hours.  ROS:  Unable to obtain  All other systems are negative except as noted above in the HPI  Past Medical History Past Medical History:  Diagnosis Date   Asthma    Chest pain    He is not known to have had a previous upper endoscopy to know if he has esophageal varices.  However, varices are noted on his initial CT scan (see report below) Last GI clinic note from Dr. Caryl Never on 03/27/2023 discussed that this patient's MELD score of 39, his active alcohol use at that time, and plans for a referral to [facility] hepatology.   However, his family say they have no knowledge of such a referral. Past Surgical History Past Surgical History:  Procedure Laterality Date   right foot surgery  1995 & 1996   spine sx  2001 and 07/2012    Family History Family History  Problem Relation Age of Onset   Cancer Mother    Alcohol abuse Mother    Alcohol abuse Father    Hypertension Sister    Diabetes Maternal Grandmother    Heart attack Maternal Grandfather     Social History Social History   Socioeconomic History   Marital status: Single    Spouse name: Not on file   Number of children: Not on file   Years of education: Not on file   Highest education level: Not on file  Occupational History   Not on file  Tobacco Use   Smoking status: Never   Smokeless tobacco: Never  Vaping Use   Vaping status: Never Used  Substance and Sexual Activity   Alcohol use: Yes    Alcohol/week: 20.0 standard drinks of alcohol    Types: 20 Shots of liquor per week    Comment: 4 liquor drinks per day   Drug use: Never   Sexual activity: Not on file  Other Topics Concern   Not on file  Social History Narrative   Not on file   Social Determinants of Health   Financial Resource Strain: Patient Declined (03/18/2023)   Received from [facility]   Overall Financial Resource Strain (CARDIA)    Difficulty of Paying Living Expenses: Patient declined  Food Insecurity: No  Food Insecurity (05/07/2023)   Hunger Vital Sign    Worried About Running Out of Food in the Last Year: Never true    Ran Out of Food in the Last Year: Never true  Transportation Needs: No Transportation Needs (05/07/2023)   PRAPARE - Administrator, Civil Service (Medical): No    Lack of Transportation (Non-Medical): No  Physical Activity: Unknown (03/18/2023)   Received from [facility]   Exercise Vital Sign    Days of Exercise per Week: Patient declined    Minutes of Exercise per Session: Not on file  Stress: Patient Declined (03/18/2023)    Received from [facility] - Occupational Stress Questionnaire    Feeling of Stress : Patient declined  Social Connections: Unknown (12/10/2021)   Received from [facility]   Social Network    Social Network: Not on file    Allergies No Known Allergies  Outpatient Meds Home medications from the H+P and/or nursing med reconciliation reviewed.  Inpatient med list reviewed  _____________________________________________________________________ Objective   Exam:  Current vital signs  Patient Vitals for the past 8 hrs:  BP Temp Temp src Pulse Resp SpO2  05/09/23 1245 113/60 (!) 96.6 F (35.9 C) Axillary 84 (!) 25 100 %  05/09/23 1215 -- -- -- 86 (!) 23 99 %  05/09/23 1200 -- -- -- 91 (!) 24 97 %  05/09/23 1145 127/77 -- -- 83 (!) 26 98 %  05/09/23 1130 (!) 106/48 -- -- 78 (!) 23 98 %  05/09/23 1115 (!) 116/54 -- -- 84 (!) 27 98 %  05/09/23 1107 -- 97.8 F (36.6 C) Axillary -- -- --  05/09/23 1106 -- -- -- 86 (!) 29 97 %  05/09/23 1105 -- -- -- 84 (!) 27 98 %  05/09/23 1104 (!) 119/53 97.8 F (36.6 C) Axillary 87 (!) 29 97 %  05/09/23 1103 -- -- -- 86 (!) 28 96 %  05/09/23 1102 -- -- -- 81 (!) 27 98 %  05/09/23 1101 -- -- -- 87 (!) 28 97 %  05/09/23 1100 -- -- -- 89 (!) 27 98 %  05/09/23 1059 -- -- -- 82 (!) 27 97 %  05/09/23 1058 -- -- -- 86 (!) 27 97 %  05/09/23 1057 -- -- -- 86 (!) 29 97 %  05/09/23 1056 -- -- -- 88 (!) 27 96 %  05/09/23 1055 -- -- -- 87 (!) 26 97 %  05/09/23 1054 -- -- -- 83 (!) 31 96 %  05/09/23 1053 -- -- -- 86 (!) 26 97 %  05/09/23 1052 -- -- -- 81 (!) 31 97 %  05/09/23 1051 -- -- -- 83 (!) 26 98 %  05/09/23 1050 -- -- -- 72 (!) 22 99 %  05/09/23 1049 -- -- -- 73 (!) 24 97 %  05/09/23 1048 -- -- -- 73 (!) 23 97 %  05/09/23 1047 -- -- -- 74 (!) 22 97 %  05/09/23 1046 -- -- -- 75 (!) 23 99 %  05/09/23 1045 103/61 (!) 97.3 F (36.3 C) Oral 73 (!) 23 97 %  05/09/23 1032 -- (!) 97.3 F  (36.3 C) Oral -- -- --  05/09/23 1030 (!) 123/58 -- -- 84 (!) 25 96 %  05/09/23 0800 (!) 146/72 -- -- (!) 101 (!) 34 91 %  05/09/23 0744 -- 98 F (36.7 C) Axillary -- -- --  05/09/23 0715 (!) 112/57 -- --  92 (!) 31 95 %  05/09/23 0700 (!) 113/59 -- -- 83 (!) 28 94 %  05/09/23 0656 (!) 113/59 (!) 97.4 F (36.3 C) Axillary 80 (!) 28 95 %  05/09/23 0645 110/64 -- -- 86 (!) 30 92 %  05/09/23 0630 (!) 115/56 -- -- 90 (!) 25 96 %  05/09/23 0615 (!) 115/52 -- -- 79 (!) 26 97 %    Intake/Output Summary (Last 24 hours) at 05/09/2023 1403 Last data filed at 05/09/2023 1245 Gross per 24 hour  Intake 2022 ml  Output 3150 ml  Net -1128 ml    Physical Exam: Stuporous.  When stimulated for physical exam he becomes agitated  Deeply jaundiced and icteric.  Unable to examine oropharynx. Eyes: sclera anicteric, no redness CV: RRR without murmur,, no JVD,, trace peripheral edema Resp: clear to auscultation bilaterally, normal RR and effort noted GI: soft, cannot determine if he has any tenderness, with active bowel sounds.  Mildly distended with probable ascites, not tympanitic or firm. Small reducible umbilical hernia Jaundice Neuro: Unable to participate in neuroexam.  He moves all extremities and moves his head around when agitated.  He is in 4 point restraints with mittens Labs:     Latest Ref Rng & Units 05/09/2023    8:41 AM 05/09/2023    3:40 AM 05/08/2023    8:15 AM  CBC  WBC 4.0 - 10.5 K/uL  23.9  32.6   Hemoglobin 13.0 - 17.0 g/dL 6.8  6.1  7.0   Hematocrit 39.0 - 52.0 % 20.0  18.0  21.2   Platelets 150 - 400 K/uL  61  67        Latest Ref Rng & Units 05/09/2023    3:40 AM 05/08/2023    8:15 AM 05/08/2023    7:19 AM  CMP  Glucose 70 - 99 mg/dL 161  096  045   BUN 6 - 20 mg/dL 46  36  36   Creatinine 0.61 - 1.24 mg/dL 4.09  8.11  9.14   Sodium 135 - 145 mmol/L 134  130  130   Potassium 3.5 - 5.1 mmol/L 5.1  4.6  4.7   Chloride 98 - 111 mmol/L 101  98  98   CO2 22 - 32  mmol/L 19  14  13    Calcium 8.9 - 10.3 mg/dL 9.4  9.0  9.0   Total Protein 6.5 - 8.1 g/dL 6.6  6.4  6.2   Total Bilirubin 0.3 - 1.2 mg/dL 78.2  95.6  21.3   Alkaline Phos 38 - 126 U/L 69  94  99   AST 15 - 41 U/L 54  62  66   ALT 0 - 44 U/L 31  32  31     Recent Labs  Lab 05/09/23 0340  INR 4.0*   _________________________________________________________ Radiologic studies: CLINICAL DATA:  Inpatient.  Sepsis.  Abnormal labs.   EXAM: CT CHEST, ABDOMEN, AND PELVIS WITH CONTRAST   TECHNIQUE: Multidetector CT imaging of the chest, abdomen and pelvis was performed following the standard protocol during bolus administration of intravenous contrast.   RADIATION DOSE REDUCTION: This exam was performed according to the departmental dose-optimization program which includes automated exposure control, adjustment of the mA and/or kV according to patient size and/or use of iterative reconstruction technique.   CONTRAST:  75mL OMNIPAQUE IOHEXOL 350 MG/ML SOLN   COMPARISON:  1624 coronary CT. 04/01/2021 pelvis CT. 05/01/2018 CT abdomen/pelvis. 10/01/2008 chest CT.   FINDINGS: CT CHEST FINDINGS  Cardiovascular: Borderline mild cardiomegaly. No significant pericardial effusion/thickening. Mildly atherosclerotic nonaneurysmal thoracic aorta. Normal caliber pulmonary arteries. No central pulmonary emboli.   Mediastinum/Nodes: No significant thyroid nodules. Large lower paraesophageal varices. Otherwise normal esophagus. No pathologically enlarged axillary, mediastinal or hilar lymph nodes.   Lungs/Pleura: No pneumothorax. Small left and trace right layering pleural effusions. Moderate left and mild right passive lower lobe atelectasis. No lung masses or significant pulmonary nodules in the aerated portions of the lungs.   Musculoskeletal: No aggressive appearing focal osseous lesions. Mild thoracic spondylosis. Mild anasarca. Moderate symmetric bilateral gynecomastia.   CT  ABDOMEN PELVIS FINDINGS   Hepatobiliary: Atrophic liver with diffusely irregular liver surface, compatible with cirrhosis. No liver masses. No radiopaque cholelithiasis. Mild diffuse gallbladder wall thickening. No gallbladder distention. No biliary ductal dilatation.   Pancreas: Normal, with no mass or duct dilation.   Spleen: Mild splenomegaly. Craniocaudal splenic length 14.2 cm. No splenic masses.   Adrenals/Urinary Tract: Normal adrenals. No renal masses. No hydronephrosis. Normal bladder.   Stomach/Bowel: Small hiatal hernia. Otherwise normal nondistended stomach. Normal caliber small bowel with no small bowel wall thickening. Appendix not discretely visualized. Normal large bowel with no diverticulosis, large bowel wall thickening or pericolonic fat stranding.   Vascular/Lymphatic: Atherosclerotic nonaneurysmal abdominal aorta. Patent portal, splenic, hepatic and renal veins. Large upper retroperitoneal portosystemic varices between the portal and left renal veins. No pathologically enlarged lymph nodes in the abdomen or pelvis.   Reproductive: Normal size prostate.   Other: No pneumoperitoneum. Small volume ascites. Ill-defined presacral space fluid and fat stranding. Mild anasarca.   Musculoskeletal: No aggressive appearing focal osseous lesions. Mild lumbar spondylosis.   IMPRESSION: 1. Cirrhosis. No liver masses. 2. Mild splenomegaly. 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 3. Evidence of fluid third spacing. Mild anasarca. Small volume ascites. Small left and trace right layering pleural effusions. 4. Nonspecific mild gallbladder wall thickening, favor noninflammatory edema. 5. Borderline mild cardiomegaly. 6. Small hiatal hernia. 7.  Aortic Atherosclerosis (ICD10-I70.0).     Electronically Signed   By: Delbert Phenix M.D.   On: 05/08/2023 11:52     ______________________________________________________ Other studies:   _______________________________________________________ Assessment & Plan  Impression:  Decompensated alcohol-related cirrhosis with reportedly recent alcohol abstinence, now with multisystem organ failure due to E. coli sepsis from probable SBP.  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  Ammonia has since normalized on lactulose enemas.  However he remains obtunded and agitated.  Acute renal failure worsening  Liver function worsening with rising bilirubin and INR  Plan: I had a long discussion with his family about his grim prognosis at present because of his worsening liver and kidney failure in the setting of systemic infection.  At this point, I think he has be given all appropriate supportive therapy. We were asked a few specific questions by the critical care team, including whether or not he is currently a candidate for liver transplantation, should he received prednisolone, and should a core track tube be placed for the purposes of giving medicines including lactulose.  He is unquestionably not a liver transplant candidate since he is in multisystem organ failure Prednisolone would not be appropriate because this is not acute alcoholic hepatitis, and he also has systemic bacterial infection Though his ammonia has normalized, he probably still has hepatic encephalopathy and it would be ideal to give him lactulose by the enteric  route but he cannot safely swallow it right now due to his altered mental status.  He clearly has portosystemic varices on imaging and might have intraluminal esophageal or gastric varices.  However, if a core track tube is cautiously and slowly placed, I think the risk of setting of variceal bleed is acceptable given the need to administer certain  medicines.  If he should develop acute variceal bleeding, then he will need intubation for airway management and urgent upper endoscopy for attempts at endoscopic control.  Should that occur, such bleeding would almost certainly can contribute to a more rapid demise for this patient.  As it is, his prognosis is quite poor on the current trajectory, I was quite frank with his family about that and they seem realistic about the situation.  I discussed all this with the critical care attending.  We greatly appreciate the opportunity to be involved in his care and will follow along with you.  Please call if urgent issues arise.    Charlie Pitter III Office: [phone]

## 2023-05-09 NOTE — Progress Notes (Signed)
PT Cancellation Note  Patient Details Name: Charles Stone MRN: [id number] DOB: 09-17-1965   Cancelled Treatment:    Reason Eval/Treat Not Completed: Patient not medically ready (Pt with medical decline since PT ordered, transferred to ICU 10/10. Will sign off and await new order)  Harrel Carina, DPT, CLT  Acute Rehabilitation Services Office: [phone] (Secure chat preferred)    Claudia Desanctis 05/09/2023, 1:05 PM

## 2023-05-09 NOTE — Progress Notes (Signed)
Patient transferred to ICU yesterday, started on Precedex drip and required vasopressors briefly.  Currently under PCCM care and we appreciate their help taking care of this patient.  Hospitalist signing off.  No charge.

## 2023-05-09 NOTE — Progress Notes (Signed)
OT Cancellation Note  Patient Details Name: Charles Stone MRN: [id number] DOB: 26-Oct-1965   Cancelled Treatment:    Reason Eval/Treat Not Completed:  (Pt with medical decline since OT ordered, transferred to ICU 10/10. Will sign off and await new order.)  Evern Bio 05/09/2023, 9:08 AM Berna Spare, OTR/L Acute Rehabilitation Services Office: [phone]

## 2023-05-09 NOTE — Progress Notes (Signed)
[facility] Progress Note   Assessment/ Plan:   **AMS secondary to decompensated cirrhosis: lactulose and precedex per primary   **AKI on CKD 3:  appears to have some mild CKD in past 6 mo now with AKI in setting of decompensated cirrhosis +  contrast exposure, markedly elevated bilirubin.  No obstruction on imaging, UA bland, urine sodium not low.  Suspect multifactorial as such cannot conclude HRS.  - albumin and NE - making some urine - d/w daugthers at bedside- no hard indications yet for dialysis but may be on the horizon  **Hyponatremia, hypovolemic: mild, follow.    **AGMA:  Lactate 10, pH 7.3. In setting of AKI, liver disease.  has rec'd supplemental IV bicarb, follow.     **Anemia:  s/p 1u pRBC, per primary. No indications for ESA.    **Decompensated etOH cirrhosis:  no EtOH for 5 wks per chart.  Followed by hepatology outpt.   Subjective:    Seen in room.  Agitated, trying to get out of mitts.  Daughters at the bedside.     Objective:   BP 113/60   Pulse 84   Temp (!) 96.6 F (35.9 C) (Axillary)   Resp (!) 25   Wt 99.2 kg   SpO2 100%   BMI 32.30 kg/m   Intake/Output Summary (Last 24 hours) at 05/09/2023 1445 Last data filed at 05/09/2023 1245 Gross per 24 hour  Intake 1978 ml  Output 3150 ml  Net -1172 ml   Weight change:   Physical Exam: Gen: agitated, lying inbed, ill appearing HEENT: EOMI, lips dry, dried blood around mouth CVS: tachycardic Resp: tachypneic Abd: soft, + distended Ext: 2+ LE edema NEURO: encephalopathic  Imaging: CT CHEST ABDOMEN PELVIS W CONTRAST  Result Date: 05/08/2023 CLINICAL DATA:  Inpatient.  Sepsis.  Abnormal labs. EXAM: CT CHEST, ABDOMEN, AND PELVIS WITH CONTRAST TECHNIQUE: Multidetector CT imaging of the chest, abdomen and pelvis was performed following the standard protocol during bolus administration of intravenous contrast. RADIATION DOSE REDUCTION: This exam was performed according to the departmental  dose-optimization program which includes automated exposure control, adjustment of the mA and/or kV according to patient size and/or use of iterative reconstruction technique. CONTRAST:  75mL OMNIPAQUE IOHEXOL 350 MG/ML SOLN COMPARISON:  1624 coronary CT. 04/01/2021 pelvis CT. 05/01/2018 CT abdomen/pelvis. 10/01/2008 chest CT. FINDINGS: CT CHEST FINDINGS Cardiovascular: Borderline mild cardiomegaly. No significant pericardial effusion/thickening. Mildly atherosclerotic nonaneurysmal thoracic aorta. Normal caliber pulmonary arteries. No central pulmonary emboli. Mediastinum/Nodes: No significant thyroid nodules. Large lower paraesophageal varices. Otherwise normal esophagus. No pathologically enlarged axillary, mediastinal or hilar lymph nodes. Lungs/Pleura: No pneumothorax. Small left and trace right layering pleural effusions. Moderate left and mild right passive lower lobe atelectasis. No lung masses or significant pulmonary nodules in the aerated portions of the lungs. Musculoskeletal: No aggressive appearing focal osseous lesions. Mild thoracic spondylosis. Mild anasarca. Moderate symmetric bilateral gynecomastia. CT ABDOMEN PELVIS FINDINGS Hepatobiliary: Atrophic liver with diffusely irregular liver surface, compatible with cirrhosis. No liver masses. No radiopaque cholelithiasis. Mild diffuse gallbladder wall thickening. No gallbladder distention. No biliary ductal dilatation. Pancreas: Normal, with no mass or duct dilation. Spleen: Mild splenomegaly. Craniocaudal splenic length 14.2 cm. No splenic masses. Adrenals/Urinary Tract: Normal adrenals. No renal masses. No hydronephrosis. Normal bladder. Stomach/Bowel: Small hiatal hernia. Otherwise normal nondistended stomach. Normal caliber small bowel with no small bowel wall thickening. Appendix not discretely visualized. Normal large bowel with no diverticulosis, large bowel wall thickening or pericolonic fat stranding. Vascular/Lymphatic: Atherosclerotic  nonaneurysmal abdominal aorta. Patent  portal, splenic, hepatic and renal veins. Large upper retroperitoneal portosystemic varices between the portal and left renal veins. No pathologically enlarged lymph nodes in the abdomen or pelvis. Reproductive: Normal size prostate. Other: No pneumoperitoneum. Small volume ascites. Ill-defined presacral space fluid and fat stranding. Mild anasarca. Musculoskeletal: No aggressive appearing focal osseous lesions. Mild lumbar spondylosis. IMPRESSION: 1. Cirrhosis. No liver masses. 2. Mild splenomegaly. 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 3. Evidence of fluid third spacing. Mild anasarca. Small volume ascites. Small left and trace right layering pleural effusions. 4. Nonspecific mild gallbladder wall thickening, favor noninflammatory edema. 5. Borderline mild cardiomegaly. 6. Small hiatal hernia. 7.  Aortic Atherosclerosis (ICD10-I70.0). Electronically Signed   By: Delbert Phenix M.D.   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 EXAM: ABDOMEN - 1 VIEW COMPARISON:  Head CT 0040 hours today. CT Abdomen and Pelvis 04/01/2021. FINDINGS: Three portable AP supine views of the abdomen and pelvis at 0430 hours. Occasional EKG leads. No metallic or radiopaque foreign body identified in the abdomen or pelvis. Patchy nonspecific lung base opacity. Paucity of bowel gas. No acute osseous abnormality identified. IMPRESSION: 1. No retained metal foreign body in the abdomen or pelvis. 2. Nonobstructed bowel-gas pattern. Nonspecific patchy lung base opacity. Electronically Signed   By: Odessa Fleming M.D.   On: 05/08/2023 05:11   CT HEAD WO CONTRAST ( )  Result Date: 05/08/2023 CLINICAL DATA:  Initial evaluation for mental status change, worsening headache. EXAM: CT HEAD WITHOUT CONTRAST TECHNIQUE: Contiguous axial images were obtained from the base of the skull  through the vertex without intravenous contrast. RADIATION DOSE REDUCTION: This exam was performed according to the departmental dose-optimization program which includes automated exposure control, adjustment of the mA and/or kV according to patient size and/or use of iterative reconstruction technique. COMPARISON:  Prior CT from 10/01/2008. FINDINGS: Brain: Cerebral volume within normal limits. No acute intracranial hemorrhage. No acute large vessel territory infarct. No mass lesion. Ventricles normal size without hydrocephalus. 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 These measure up to 3-4 mm in maximal thickness. No significant mass effect or midline shift. Vascular: No abnormal hyperdense vessel. Calcified atherosclerosis present about the skull base. Skull: Scalp soft tissues demonstrate no acute finding. Calvarium intact. Sinuses/Orbits: Globes and orbital soft tissues within normal limits. Mild mucosal thickening present about the ethmoidal air cells and maxillary sinuses. Paranasal sinuses are otherwise clear. No mastoid effusion. Other: None. IMPRESSION: 1. 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 These measure up to 3-4 mm in maximal thickness. No significant mass effect or midline shift. 2. No other acute intracranial abnormality. Electronically Signed   By: Rise Mu M.D.   On: 05/08/2023 02:20    Labs: BMET Recent Labs  Lab 05/06/23 1613 05/07/23 0406 05/08/23 0719 05/08/23 0815 05/09/23 0340  NA 130* 132* 130* 130* 134*  K 4.5 4.4 4.7 4.6 5.1  CL 100 101 98 98 101  CO2 21* 23 13* 14* 19*  GLUCOSE 112* 115* 112* 118* 135*  BUN 26* 27* 36* 36* 46*  CREATININE 1.68* 1.59* 2.59* 2.72* 2.92*  CALCIUM 9.1 9.3 9.0 9.0 9.4  PHOS  --  3.5  --   --  6.5*   CBC Recent Labs  Lab 05/06/23 1613 05/07/23 0406  05/08/23 0815 05/09/23 0340 05/09/23 0841  WBC 8.6 8.8  32.6* 23.9*  --   NEUTROABS  --  6.4  --  21.1*  --   HGB 6.7* 7.2* 7.0* 6.1* 6.8*  HCT 20.1* 21.4* 21.2* 18.0* 20.0*  MCV 130.5* 125.1* 129.3* 129.5*  --   PLT 72* 71* 67* 61*  --     Medications:     sodium chloride   Intravenous Once   Chlorhexidine Gluconate Cloth  6 each Topical Daily   lactulose  300 mL Rectal TID   pantoprazole (PROTONIX) IV  40 mg Intravenous Q0600   sodium chloride flush  3 mL Intravenous Q12H   sodium chloride flush  3 mL Intravenous Q12H    Bufford Buttner, MD 05/09/2023, 2:45 PM

## 2023-05-09 NOTE — Progress Notes (Signed)
Initial Nutrition Assessment  DOCUMENTATION CODES:   Not applicable  INTERVENTION:   Once Cortrak placement confirmed with x-ray, initiate enteral nutrition: - Start Osmolite 1.5 @ 25 ml/hr and advance rate by 10 ml every 8 hours to goal rate of 55 ml/hr (1320 ml/day) - PROSource TF20 60 ml daily  Tube feeding regimen at goal rate provides 2060 kcal, 103 grams of protein, and 1006 ml of H2O.  Monitor magnesium, potassium, and phosphorus every 12 hours for at least 6 occurrences. MD to replete as needed as pt is at risk for refeeding syndrome given ETOH abuse.  - Recommend thiamine 100 mg daily for at least 5 days following initiation of nutrition support given refeeding risk  - MVI with minerals daily per tube  NUTRITION DIAGNOSIS:   Inadequate oral intake related to inability to eat as evidenced by NPO status.  GOAL:   Patient will meet greater than or equal to 90% of their needs  MONITOR:   Diet advancement, Labs, Weight trends, TF tolerance, I & O's  REASON FOR ASSESSMENT:   Consult Enteral/tube feeding initiation and management  ASSESSMENT:   57 year old male who presented to the ED on 10/08 with AMS and lethargy. 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 Pt admitted with decompensated alcoholic liver cirrhosis with acute hepatic encephalopathy, AKI on CKD 3.  10/10 - pt transferred to ICU, NPO 10/11 - Cortrak placed (x-ray pending)  Discussed pt with RN and during ICU rounds. Cortrak placed today for reliable enteral access as pt is obtunded and NPO at this time. X-ray to confirm Cortrak placement is pending. Consult received for enteral nutrition initiation and management.  Pt's daughters report that pt has had some weight loss recently. Pt lives at home with his girlfriend. Daughters report pt does not have a very healthy diet and eats foods like vienna sausages.  Reviewed weight history in chart. Pt with  progressive weight gain over the last 5 months; suspect some of weight gain can be attributed to volume status given cirrhosis diagnosis and moderate pitting edema to BLE.  Will start tube feeds at trickle rate and slowly advance to goal. Will monitor for refeeding.  Medications reviewed and include: lactulose 20 grams BID, IV protonix, IV albumin 25 grams x 4, IV abx, precedex gtt  Labs reviewed: sodium 134, BUN 46, creatinine 2.92, phosphorus 6.5, magnesium 2.5, lactic acid 4.5, WBC 23.9, hemoglobin 6.8, HCT 20.0, platelets 61, INR 4.0 CBG's: 101-141 x 24 hours  UOP: 500 ml + 1 unmeasured occurrence x 24 hours Stool: 2475 ml + 1 unmeasured occurrence x 24 hours I/O's: -751 ml since admit  NUTRITION - FOCUSED PHYSICAL EXAM:  Flowsheet Row Most Recent Value  Orbital Region Mild depletion  Upper Arm Region No depletion  Thoracic and Lumbar Region No depletion  Buccal Region No depletion  Temple Region Mild depletion  Clavicle Bone Region Mild depletion  Clavicle and Acromion Bone Region Mild depletion  Scapular Bone Region No depletion  Dorsal Hand Mild depletion  Patellar Region No depletion  Anterior Thigh Region No depletion  Posterior Calf Region No depletion  Edema (RD Assessment) Moderate  [BLE]  Hair Reviewed  Eyes Reviewed  Mouth Reviewed  Skin Reviewed  [jaundice]  Nails Reviewed    Diet Order:   Diet Order             Diet NPO time specified  Diet effective now  EDUCATION NEEDS:   Not appropriate for education at this time  Skin:  Skin Assessment: Reviewed RN Assessment  Last BM:  05/09/23 rectal tube  Height:   Ht Readings from Last 1 Encounters:  02/11/23 5\' 9"  (1.753 m)    Weight:   Wt Readings from Last 1 Encounters:  05/09/23 99.2 kg    Ideal Body Weight:  72.7 kg  BMI:  Body mass index is 32.3 kg/m.  Estimated Nutritional Needs:   Kcal:  2000-2200  Protein:  100-115 grams  Fluid:  1.8 L/day    Mertie Clause, MS, RD, LDN Registered Dietitian II Please see AMiON for contact information.

## 2023-05-09 NOTE — Procedures (Addendum)
Cortrak  Person Inserting Tube:  Kendell Bane C, RD Tube Type:  Cortrak - 43 inches Tube Size:  10 Tube Location:  Left nare Secured by: Bridle Technique Used to Measure Tube Placement:  Marking at nare/corner of mouth Cortrak Secured At:  67 cm   Cortrak Tube Team Note:  Consult received to place a Cortrak feeding tube.  Spoke with Alphonzo Lemmings, NP regarding cortrak placement. INR 4.0 but did receive Vitamin K infusion this am. GI following and ok'ed placing cortrak today with known risk for possible bleeding during procedure.   X-ray is required, abdominal x-ray has been ordered by the Cortrak team. Please confirm tube placement before using the Cortrak tube.   If the tube becomes dislodged please keep the tube and contact the Cortrak team at [email or web address] for replacement.  If after hours and replacement cannot be delayed, place a NG tube and confirm placement with an abdominal x-ray.    Cammy Copa., RD, LDN, CNSC See AMiON for contact information

## 2023-05-10 DIAGNOSIS — K652 Spontaneous bacterial peritonitis: Secondary | ICD-10-CM | POA: Diagnosis not present

## 2023-05-10 DIAGNOSIS — K7290 Hepatic failure, unspecified without coma: Secondary | ICD-10-CM | POA: Diagnosis not present

## 2023-05-10 DIAGNOSIS — D649 Anemia, unspecified: Secondary | ICD-10-CM | POA: Diagnosis not present

## 2023-05-10 DIAGNOSIS — K7682 Hepatic encephalopathy: Secondary | ICD-10-CM | POA: Diagnosis not present

## 2023-05-10 LAB — BPAM RBC
Blood Product Expiration Date: 202411052359
Blood Product Expiration Date: 202411052359
Blood Product Expiration Date: 202411072359
ISSUE DATE / TIME: 202410082053
ISSUE DATE / TIME: 202410110442
ISSUE DATE / TIME: 202410111038
Unit Type and Rh: 6200
Unit Type and Rh: 6200
Unit Type and Rh: 6200

## 2023-05-10 LAB — GLUCOSE, CAPILLARY
Glucose-Capillary: 134 mg/dL — ABNORMAL HIGH (ref 70–99)
Glucose-Capillary: 140 mg/dL — ABNORMAL HIGH (ref 70–99)
Glucose-Capillary: 157 mg/dL — ABNORMAL HIGH (ref 70–99)

## 2023-05-10 LAB — TYPE AND SCREEN
ABO/RH(D): A POS
Antibody Screen: NEGATIVE
Unit division: 0
Unit division: 0
Unit division: 0

## 2023-05-10 LAB — PREPARE FRESH FROZEN PLASMA: Unit division: 0

## 2023-05-10 LAB — CULTURE, BLOOD (ROUTINE X 2)
Special Requests: ADEQUATE
Special Requests: ADEQUATE

## 2023-05-10 LAB — COMPREHENSIVE METABOLIC PANEL
ALT: 24 U/L (ref 0–44)
AST: 49 U/L — ABNORMAL HIGH (ref 15–41)
Albumin: 3.5 g/dL (ref 3.5–5.0)
Alkaline Phosphatase: 65 U/L (ref 38–126)
Anion gap: 13 (ref 5–15)
BUN: 75 mg/dL — ABNORMAL HIGH (ref 6–20)
CO2: 21 mmol/L — ABNORMAL LOW (ref 22–32)
Calcium: 9.3 mg/dL (ref 8.9–10.3)
Chloride: 102 mmol/L (ref 98–111)
Creatinine, Ser: 4.1 mg/dL — ABNORMAL HIGH (ref 0.61–1.24)
GFR, Estimated: 16 mL/min — ABNORMAL LOW (ref >60)
Glucose, Bld: 156 mg/dL — ABNORMAL HIGH (ref 70–99)
Potassium: 4.6 mmol/L (ref 3.5–5.1)
Sodium: 136 mmol/L (ref 135–145)
Total Bilirubin: 19.4 mg/dL (ref 0.3–1.2)
Total Protein: 7.2 g/dL (ref 6.5–8.1)

## 2023-05-10 LAB — CBC
HCT: 20.3 % — ABNORMAL LOW (ref 39.0–52.0)
Hemoglobin: 6.9 g/dL — CL (ref 13.0–17.0)
MCH: 39.4 pg — ABNORMAL HIGH (ref 26.0–34.0)
MCHC: 34 g/dL (ref 30.0–36.0)
MCV: 116 fL — ABNORMAL HIGH (ref 80.0–100.0)
Platelets: 58 10*3/uL — ABNORMAL LOW (ref 150–400)
RBC: 1.75 MIL/uL — ABNORMAL LOW (ref 4.22–5.81)
WBC: 17.5 10*3/uL — ABNORMAL HIGH (ref 4.0–10.5)
nRBC: 0.2 % (ref 0.0–0.2)

## 2023-05-10 LAB — PHOSPHORUS: Phosphorus: 5.5 mg/dL — ABNORMAL HIGH (ref 2.5–4.6)

## 2023-05-10 LAB — BPAM FFP
Blood Product Expiration Date: 202410152359
Blood Product Expiration Date: 202410152359
ISSUE DATE / TIME: 202410112037
ISSUE DATE / TIME: 202410112037
Unit Type and Rh: 6200
Unit Type and Rh: 6200

## 2023-05-10 LAB — MAGNESIUM: Magnesium: 2.9 mg/dL — ABNORMAL HIGH (ref 1.7–2.4)

## 2023-05-10 LAB — PREPARE RBC (CROSSMATCH)

## 2023-05-10 MED ORDER — GLYCOPYRROLATE 0.2 MG/ML IJ SOLN: 0.2000 mg | INTRAMUSCULAR | Status: DC | PRN

## 2023-05-10 MED ORDER — MIDODRINE HCL 5 MG PO TABS: 10.0000 mg | ORAL_TABLET | Freq: Three times a day (TID) | ORAL | Status: DC

## 2023-05-10 MED ORDER — POLYVINYL ALCOHOL 1.4 % OP SOLN: 1.0000 [drp] | Freq: Four times a day (QID) | OPHTHALMIC | Status: DC | PRN

## 2023-05-10 MED ORDER — SODIUM CHLORIDE 0.9% IV SOLUTION: Freq: Once | INTRAVENOUS | Status: AC

## 2023-05-10 MED ORDER — ORAL CARE MOUTH RINSE
15.0000 mL | OROMUCOSAL | Status: DC
  Administered 2023-05-10 – 2023-05-13 (×15): 15 mL via OROMUCOSAL

## 2023-05-10 MED ORDER — MORPHINE SULFATE (PF) 2 MG/ML IV SOLN
2.0000 mg | INTRAVENOUS | Status: DC | PRN
  Administered 2023-05-10 – 2023-05-11 (×8): 2 mg via INTRAVENOUS
  Administered 2023-05-12 (×2): 4 mg via INTRAVENOUS
  Filled 2023-05-10: qty 1
  Filled 2023-05-10: qty 2
  Filled 2023-05-10 (×2): qty 1
  Filled 2023-05-10 (×2): qty 2
  Filled 2023-05-10 (×4): qty 1

## 2023-05-10 MED ORDER — GLYCOPYRROLATE 1 MG PO TABS: 1.0000 mg | ORAL_TABLET | ORAL | Status: DC | PRN

## 2023-05-10 MED ORDER — SODIUM CHLORIDE 0.9 % IV SOLN
50.0000 ug/h | INTRAVENOUS | Status: DC
  Administered 2023-05-10: 50 ug/h via INTRAVENOUS
  Filled 2023-05-10: qty 1

## 2023-05-10 MED ORDER — ORAL CARE MOUTH RINSE: 15.0000 mL | OROMUCOSAL | Status: DC | PRN

## 2023-05-10 NOTE — Plan of Care (Signed)
  Problem: Education: Goal: Knowledge of General Education information will improve Description: Including pain rating scale, medication(s)/side effects and non-pharmacologic comfort measures Outcome: Not Progressing   Problem: Health Behavior/Discharge Planning: Goal: Ability to manage health-related needs will improve Outcome: Not Progressing   Problem: Clinical Measurements: Goal: Ability to maintain clinical measurements within normal limits will improve Outcome: Not Progressing Goal: Will remain free from infection Outcome: Not Progressing Goal: Diagnostic test results will improve Outcome: Not Progressing Goal: Respiratory complications will improve Outcome: Not Progressing Goal: Cardiovascular complication will be avoided Outcome: Not Progressing   Problem: Activity: Goal: Risk for activity intolerance will decrease Outcome: Not Progressing   Problem: Nutrition: Goal: Adequate nutrition will be maintained Outcome: Not Progressing   Problem: Coping: Goal: Level of anxiety will decrease Outcome: Not Progressing   Problem: Elimination: Goal: Will not experience complications related to bowel motility Outcome: Not Progressing Goal: Will not experience complications related to urinary retention Outcome: Not Progressing   Problem: Pain Managment: Goal: General experience of comfort will improve Outcome: Progressing   Problem: Safety: Goal: Ability to remain free from injury will improve Outcome: Not Progressing   Problem: Skin Integrity: Goal: Risk for impaired skin integrity will decrease Outcome: Not Progressing   Problem: Safety: Goal: Non-violent Restraint(s) Outcome: Completed/Met

## 2023-05-10 NOTE — IPAL (Signed)
  Interdisciplinary Goals of Care Family Meeting   Date carried out: 05/10/2023  Location of the meeting: Conference room  Member's involved: Nurse Practitioner, Bedside Registered Nurse, and Family Member or next of kin  Durable Power of Attorney or acting medical decision maker: Shared among children and significant other  Discussion:Charles Stone has 3 children all of which were present again a.m. of 10/12 for GOC discussion along with significant other.  All family members state they understand how sick Charles Stone is and understand the expected prognosis. At this time they would like to continue aggressive interventions and discussed the possibility of hemodialysis with nephrology.  But again articulate if quality of life is not able to be preserved and ongoing goals of care needs to be discussed  Code status: Full Code  Disposition: Continue current acute care   Time spent for the meeting: 25 mins  Charles Stone 05/10/2023, 10:46 AM

## 2023-05-10 NOTE — Plan of Care (Signed)

## 2023-05-10 NOTE — Progress Notes (Signed)
[facility] Progress Note   Assessment/ Plan:   **AMS secondary to decompensated cirrhosis: lactulose and precedex per primary   **AKI on CKD 3:  appears to have some mild CKD in past 6 mo now with AKI in setting of decompensated cirrhosis +  contrast exposure, markedly elevated bilirubin.  No obstruction on imaging, UA bland, urine sodium not low.  Suspect multifactorial as such cannot conclude HRS.  - albumin and NE - no dialysis - appears that GOC are now for comfort- will sign off.  Please call with any questions.  **Hyponatremia, hypovolemic: mild, follow.    **AGMA:  Lactate 10, pH 7.3. In setting of AKI, liver disease.  has rec'd supplemental IV bicarb, follow.     **Anemia:  s/p 1u pRBC, per primary. No indications for ESA.    **Decompensated etOH cirrhosis:  no EtOH for 5 wks per chart.  Followed by hepatology outpt.   Subjective:    Met with SO michelle and children Rich Number, and Miranda in the family room.  Discussed that pt is not doing well and maximal interventions are being performed to no avail.  Discussed dialysis- I explained that it would not fix the underlying issue and would likely prolong the inevitable.  They agree for no dialysis.     Objective:   BP (!) 105/59   Pulse 70   Temp 98.6 F (37 C) (Oral)   Resp 18   Wt 97.8 kg   SpO2 96%   BMI 31.84 kg/m   Intake/Output Summary (Last 24 hours) at 05/10/2023 1527 Last data filed at 05/10/2023 1500 Gross per 24 hour  Intake 3034.31 ml  Output 1480 ml  Net 1554.31 ml   Weight change: 0.2 kg  Physical Exam: Gen: agitated, lying inbed, ill appearing HEENT: EOMI, lips dry, dried blood around mouth CVS: tachycardic Resp: tachypneic Abd: soft, + distended Ext: 2+ LE edema NEURO: encephalopathic  Imaging: DG Abd Portable 1V  Result Date: 05/09/2023 CLINICAL DATA:  Feeding tube EXAM: PORTABLE ABDOMEN - 1 VIEW COMPARISON:  Abdominal x-ray 05/08/2023 FINDINGS: Feeding tube tip is at  the level of the gastric antrum. IMPRESSION: Feeding tube tip is at the level of the gastric antrum. Electronically Signed   By: Darliss Cheney M.D.   On: 05/09/2023 19:01    Labs: BMET Recent Labs  Lab 05/06/23 1613 05/07/23 0406 05/08/23 0719 05/08/23 0815 05/09/23 0340 05/09/23 1754 05/10/23 0608  NA 130* 132* 130* 130* 134*  --  136  K 4.5 4.4 4.7 4.6 5.1  --  4.6  CL 100 101 98 98 101  --  102  CO2 21* 23 13* 14* 19*  --  21*  GLUCOSE 112* 115* 112* 118* 135*  --  156*  BUN 26* 27* 36* 36* 46*  --  75*  CREATININE 1.68* 1.59* 2.59* 2.72* 2.92*  --  4.10*  CALCIUM 9.1 9.3 9.0 9.0 9.4  --  9.3  PHOS  --  3.5  --   --  6.5* 6.8* 5.5*   CBC Recent Labs  Lab 05/07/23 0406 05/08/23 0815 05/09/23 0340 05/09/23 0841 05/09/23 1506 05/09/23 1902 05/10/23 0743  WBC 8.8 32.6* 23.9*  --   --   --  17.5*  NEUTROABS 6.4  --  21.1*  --   --   --   --   HGB 7.2* 7.0* 6.1* 6.8* 7.7* 7.9* 6.9*  HCT 21.4* 21.2* 18.0* 20.0* 22.9* 22.5* 20.3*  MCV 125.1* 129.3* 129.5*  --   --   --  116.0*  PLT 71* 67* 61*  --   --   --  58*    Medications:     sodium chloride   Intravenous Once   Chlorhexidine Gluconate Cloth  6 each Topical Daily   mouth rinse  15 mL Mouth Rinse 4 times per day    Bufford Buttner, MD 05/10/2023, 3:27 PM

## 2023-05-10 NOTE — Progress Notes (Signed)
ANYONE VISITING MUST HAVE PASSWORD OR CAN NOT VISIT

## 2023-05-10 NOTE — Progress Notes (Signed)
NAME:  Charles Stone, MRN:  [id number], DOB:  05/03/1966, LOS: 3 ADMISSION DATE:  05/04/2023, CONSULTATION DATE:  10/10 REFERRING MD:  Jacqulyn Bath, CHIEF COMPLAINT:  Hepatic encephalopathy   History of Present Illness:  Charles Stone is a 57yo M w/ hx of cirrhosis, alcohol use disorder, CKD stage3, anemia, thrombocytopenia who was admitted 10/9 for 2wks of confusion, found to have hepatic encephalopathy. Pt was being treated w/ lactulose,  but then became acutely obtunded ON and was sent to ICU.  Pertinent  Medical History  Decompensated liver cirrhosis, hx of alcohol use disorder (last drink August), CKD3, anemia, thrombocytopenia  Significant Hospital Events: Including procedures, antibiotic start and stop dates in addition to other pertinent events   10/9 Admitted for hepatic encephalopathy to [facility] 10/10 Acutely obtunded, transfer to ICU 10/11 continues to intermittently require vasopressor support, blood cultures positive for E. coli bacteremia.  Very obtunded this a.m. 10/12 continued issues with coagulopathy overnight with INR back to 4.0, hemoglobin 6.9, renal function also worsened with creatinine up to 4.10  Interim History / Subjective:  Will open eyes to verbal stimuli  Objective   Blood pressure (!) 106/55, pulse 76, temperature 99.4 F (37.4 C), temperature source Axillary, resp. rate (!) 23, weight 97.8 kg, SpO2 92%.        Intake/Output Summary (Last 24 hours) at 05/10/2023 1006 Last data filed at 05/10/2023 0900 Gross per 24 hour  Intake 2317.24 ml  Output 1645 ml  Net 672.24 ml   Filed Weights   05/08/23 0900 05/09/23 0500 05/10/23 0500  Weight: 97.6 kg 99.2 kg 97.8 kg    Examination: General: Acute on chronically ill appearing middle-aged male lying in bed in no acute distress HEENT: [place]/AT, mucous membranes dry, icteric sclera Neuro: Will open eyes and state name but unable to follow commands consistently CV: s1s2 regular rate and rhythm, no murmur, rubs, or  gallops,  PULM: Diminished breath sounds bilaterally, no increased work of breathing, no added breath sounds GI: soft, bowel sounds active in all 4 quadrants, non-tender, non-distended, tolerating TF Extremities: warm/dry, no edema  Skin: no rashes or lesions   Resolved Hospital Problem list     Assessment & Plan:   Hepatic Encephalopathy Chronic Subdural Hygromas  -CT head showed chronic subdural hydromas w/o significant mass effect. P: Minimize sedation as able Maintain neuroprotective measures Continue lactulose Nutrition and bowel regiment Aspiration precautions Seizure precautions  Decompensated Cirrhosis 2/2 Alcohol  Daily alcohol use  -MELD-Na: 44  P: GI consulted, appreciate assistance Continue lactulose and rifaximin Treatment of coagulopathy as below Monitor for signs of withdrawal  At risk for respiratory decompensation -Attended this a.m. only question ability to continue to protect airway.  P: Minimize sedation as able Aspiration precautions Pulmonary hygiene as able Close monitoring of airway and ICU  Pansensitive E.coli bacteriemia likely secondary to SBP P: Continue IV ceftriaxone Follow CBC and fever curve Continue pressors for MAP goal greater than 65  Acute kidney injury superimposed on CKD stage 3 Concern for hepatorenal syndrome -Acute rise in Cr from 1.59 to 2.72. Suspect poor profusion 2/2 cardiorenal and.or contrast induced nephropathy  -No improvement seen with scheduled albumin P: Nephrology following appreciate assistance Closely approaching need for hemodialysis, family would like to discuss with nephrology prior to decision being made Follow renal function Monitor urine output Trend Bement Avoid nephrotoxins Pressor support as above  Chronic Anemia  Chronic Thrombocytopenia -S/p 3u pRBC and 4 FFP thus far  Elevated PT/INR -Likely 2/2 hepatic dysfunction.  P: Transfuse additional PRBC and FFP  Trend H&H and INR Transfuse  per protocol Hemoglobin goal greater than 7 Platelet goal greater than 10  Anion Gap Metabolic Acidosis  Lactic Acidosis -ABG c/w metabolic acidosis ph 7.3. Anion gap 18. Lactic acid >9 in the setting of bacteremia  P: Trend Bmet   Hyponatremia  P: Trend   Goals of care  At this time family would like to continue aggressive interventions and discuss the option of hemodialysis  with nephrology  Best Practice (right click and "Reselect all SmartList Selections" daily)   Diet/type: NPO DVT prophylaxis: SCD GI prophylaxis: PPI Lines: PIV Foley:  N/A Code Status:  full code Last date of multidisciplinary goals of care discussion spoke w/ family at bedside [10/10]  CRITICAL CARE Performed by: Bertel Venard D. Harris  Total critical care time: 45 minutes  Critical care time was exclusive of separately billable procedures and treating other patients.  Critical care was necessary to treat or prevent imminent or life-threatening deterioration.  Critical care was time spent personally by me on the following activities: development of treatment plan with patient and/or surrogate as well as nursing, discussions with consultants, evaluation of patient's response to treatment, examination of patient, obtaining history from patient or surrogate, ordering and performing treatments and interventions, ordering and review of laboratory studies, ordering and review of radiographic studies, pulse oximetry and re-evaluation of patient's condition.  Ford Peddie D. Harris, NP-C [facility] Personal contact information can be found on Amion  If no contact or response made please call [phone] 05/10/2023, 10:06 AM

## 2023-05-10 NOTE — Progress Notes (Signed)
PCCM Progress Note  Family has elected to proceed with comfort care. Will slowly transition to allow family time to visit.   Mandalyn Pasqua D. Harris, NP-C [facility] Personal contact information can be found on Amion  If no contact or response made please call [phone] 05/10/2023, 2:57 PM

## 2023-05-10 NOTE — Progress Notes (Signed)
0800 patient alert to self lethargic decreasing precedex and levophed  1100 Precedex off 1205 levo off patient more alert self place and time year and month restraints removed 1500 unit of PRBCs completed pt tolerated well family has decided to go comfort care

## 2023-05-10 NOTE — Progress Notes (Signed)
[facility] Progress Note  Chief Complaint: Decompensation of chronic alcohol related cirrhosis and SBP  History:  Mr. Camacho uneventfully had a core track tube placed yesterday and started both lactulose and tube feeds through.  He is somewhat more interactive today which is encouraging to his family.  He remains deeply jaundiced and icteric and in restraints because he is intermittently agitated.  He does not seem to be having any abdominal pain as near as we can determine, though he is fairly stuporous.  Unable to obtain any additional review of systems.  Objective:   Current Facility-Administered Medications:    0.9 %  sodium chloride infusion (Manually program via Guardrails IV Fluids), , Intravenous, Once, Harris, Whitney D, NP, Stopped at 05/09/23 1354   acetaminophen (TYLENOL) tablet 500 mg, 500 mg, Oral, Q6H PRN **OR** acetaminophen (TYLENOL) suppository 650 mg, 650 mg, Rectal, Q6H PRN, Calton Dach I, RPH   albuterol (PROVENTIL) (2.5 MG/3ML) 0.083% nebulizer solution 3 mL, 3 mL, Inhalation, Q4H PRN, Dolly Rias, MD   cefTRIAXone (ROCEPHIN) 2 g in sodium chloride 0.9 % 100 mL IVPB, 2 g, Intravenous, Q24H, Calton Dach I, RPH, Stopped at 05/10/23 0606   Chlorhexidine Gluconate Cloth 2 % PADS 6 each, 6 each, Topical, Daily, Albustami, Flonnie Hailstone, MD, 6 each at 05/10/23 1148   dexmedetomidine (PRECEDEX) 400 MCG/100ML (4 mcg/mL) infusion, 0-0.6 mcg/kg/hr, Intravenous, Titrated, Olalere, Adewale A, MD, Stopped at 05/10/23 1205   feeding supplement (OSMOLITE 1.5 CAL) liquid 1,000 mL, 1,000 mL, Per Tube, Q24H, Harris, Whitney D, NP, Infusion Verify at 05/10/23 1300   feeding supplement (PROSource TF20) liquid 60 mL, 60 mL, Per Tube, Daily, Harris, Whitney D, NP, 60 mL at 05/10/23 1024   lactulose (CHRONULAC) 10 GM/15ML solution 20 g, 20 g, Oral, BID, Harris, Whitney D, NP, 20 g at 05/10/23 0747   midodrine (PROAMATINE) tablet 10 mg, 10 mg, Oral, TID WC, Harris, Whitney D, NP    multivitamin with minerals tablet 1 tablet, 1 tablet, Per Tube, Daily, Janeann Forehand D, NP, 1 tablet at 05/10/23 0929   norepinephrine (LEVOPHED) 4mg  in (0.016 mg/mL) premix infusion, 2-10 mcg/min, Intravenous, Titrated, Paliwal, Aditya, MD, Stopped at 05/10/23 1303   octreotide (SANDOSTATIN) 500 mcg in sodium chloride 0.9 % 250 mL (2 mcg/mL) infusion, 50 mcg/hr, Intravenous, Continuous, Harris, Whitney D, NP   ondansetron (ZOFRAN) injection 4 mg, 4 mg, Intravenous, Q6H PRN, Emokpae, Courage, MD, 4 mg at 05/07/23 2329   Oral care mouth rinse, 15 mL, Mouth Rinse, 4 times per day, Olalere, Adewale A, MD, 15 mL at 05/10/23 1148   Oral care mouth rinse, 15 mL, Mouth Rinse, PRN, Olalere, Adewale A, MD   pantoprazole (PROTONIX) injection 40 mg, 40 mg, Intravenous, Q0600, Patrici Ranks, MD, 40 mg at 05/10/23 0534   sodium chloride flush (NS) 0.9 % injection 3 mL, 3 mL, Intravenous, Q12H, Segars, Jonathan, MD, 3 mL at 05/10/23 0929   sodium chloride flush (NS) 0.9 % injection 3 mL, 3 mL, Intravenous, Q12H, Pahwani, Ravi, MD, 3 mL at 05/10/23 0929   cefTRIAXone (ROCEPHIN)  IV Stopped (05/10/23 0606)   dexmedetomidine (PRECEDEX) IV infusion Stopped (05/10/23 1205)   norepinephrine (LEVOPHED) Adult infusion Stopped (05/10/23 1303)   octreotide (SANDOSTATIN) 500 mcg in sodium chloride 0.9 % 250 mL (2 mcg/mL) infusion       Vital signs in last 24 hrs: Vitals:   05/10/23 1300 05/10/23 1315  BP: (!) 95/55   Pulse: 64   Resp: 20   Temp:  98.6 F (37 C)  SpO2: 92%     Intake/Output Summary (Last 24 hours) at 05/10/2023 1354 Last data filed at 05/10/2023 1300 Gross per 24 hour  Intake 2598.15 ml  Output 1480 ml  Net 1118.15 ml     Physical Exam Deeply icteric and jaundiced, awakens briefly to stimulation, confused  Cardiac: RRR without murmurs, S1S2 heard, trace peripheral edema Pulm: clear to auscultation bilaterally, normal RR and effort noted Abdomen: soft, nontense ascites, no  apparent tenderness, with active bowel sounds. No guarding or palpable hepatosplenomegaly  Recent Labs:     Latest Ref Rng & Units 05/10/2023    7:43 AM 05/09/2023    7:02 PM 05/09/2023    3:06 PM  CBC  WBC 4.0 - 10.5 K/uL 17.5     Hemoglobin 13.0 - 17.0 g/dL 6.9  7.9  7.7   Hematocrit 39.0 - 52.0 % 20.3  22.5  22.9   Platelets 150 - 400 K/uL 58       Recent Labs  Lab 05/09/23 1902  INR 4.2*   INR was 4.0 yesterday     Latest Ref Rng & Units 05/10/2023    6:08 AM 05/09/2023    3:40 AM 05/08/2023    8:15 AM  CMP  Glucose 70 - 99 mg/dL 161  096  045   BUN 6 - 20 mg/dL 75  46  36   Creatinine 0.61 - 1.24 mg/dL 4.09  8.11  9.14   Sodium 135 - 145 mmol/L 136  134  130   Potassium 3.5 - 5.1 mmol/L 4.6  5.1  4.6   Chloride 98 - 111 mmol/L 102  101  98   CO2 22 - 32 mmol/L 21  19  14    Calcium 8.9 - 10.3 mg/dL 9.3  9.4  9.0   Total Protein 6.5 - 8.1 g/dL 7.2  6.6  6.4   Total Bilirubin 0.3 - 1.2 mg/dL 78.2  95.6  21.3   Alkaline Phos 38 - 126 U/L 65  69  94   AST 15 - 41 U/L 49  54  62   ALT 0 - 44 U/L 24  31  32      Radiologic studies:   Assessment & Plan  Assessment: Decompensation of alcohol-related cirrhosis that appears to be from sepsis due to E. coli SBP.  Acute renal failure that is unfortunately steadily worsening in the last few days.  Altered mental status from acute illness delirium and hepatic encephalopathy along with uremia.  Although he had some improvement in his mental status and his bilirubin seems to be fluctuating the similar range over the last few days, his INR is up a little and, more than all that, his renal function is steadily worsening.  These are all poor prognostic signs and his family seems to be preparing for the likelihood of him succumbing to this illness.  We had a discussion with them along with critical care physician and they seem realistic.  They asked good questions regarding the timing of progression which is of course  uncertain but appears to be a matter of days.  He is receiving all appropriate supportive care.  They have requested discussion with the nephrologist regarding the timing and possible utility of dialysis.  I agree with the critical care physician's sentiment that dialysis will not likely save this man's life since he is in hepatic failure.   Charlie Pitter III Office: [phone]

## 2023-05-11 ENCOUNTER — Telehealth: Payer: Self-pay | Admitting: Pulmonary Disease

## 2023-05-11 DIAGNOSIS — D649 Anemia, unspecified: Secondary | ICD-10-CM | POA: Diagnosis not present

## 2023-05-11 LAB — TYPE AND SCREEN
ABO/RH(D): A POS
Antibody Screen: NEGATIVE
Unit division: 0

## 2023-05-11 LAB — BPAM RBC
Blood Product Expiration Date: 202411112359
ISSUE DATE / TIME: 202410121157
Unit Type and Rh: 6200

## 2023-05-11 MED ORDER — MORPHINE 100MG IN NS 100ML (1MG/ML) PREMIX INFUSION
2.0000 mg/h | INTRAVENOUS | Status: DC
  Administered 2023-05-11: 2 mg/h via INTRAVENOUS
  Administered 2023-05-12: 4 mg/h via INTRAVENOUS
  Administered 2023-05-13: 5 mg/h via INTRAVENOUS
  Filled 2023-05-11 (×3): qty 100

## 2023-05-11 NOTE — Progress Notes (Signed)
Patient ID: Charles Stone, male   DOB: February 08, 1966, 57 y.o.   MRN: [id number]   Brief GI note;  Critical care notes reviewed, note that family has made decision to transition to comfort care. This is very appropriate.  We had discussed at length with patient's children yesterday.  GI will be available as needed.

## 2023-05-11 NOTE — Plan of Care (Signed)
  Problem: Education: Goal: Knowledge of General Education information will improve Description: Including pain rating scale, medication(s)/side effects and non-pharmacologic comfort measures Outcome: Not Progressing   Problem: Health Behavior/Discharge Planning: Goal: Ability to manage health-related needs will improve Outcome: Not Progressing   Problem: Activity: Goal: Risk for activity intolerance will decrease Outcome: Not Progressing   Problem: Coping: Goal: Level of anxiety will decrease Outcome: Not Progressing   Problem: Pain Managment: Goal: General experience of comfort will improve Outcome: Not Progressing

## 2023-05-11 NOTE — Plan of Care (Signed)
Problem: Nutrition: Goal: Adequate nutrition will be maintained Outcome: Progressing   Problem: Coping: Goal: Level of anxiety will decrease Outcome: Progressing   Problem: Pain Managment: Goal: General experience of comfort will improve Outcome: Progressing   Problem: Safety: Goal: Ability to remain free from injury will improve Outcome: Progressing

## 2023-05-11 NOTE — TOC Progression Note (Addendum)
Transition of Care [facility]) - Progression Note    Patient Details  Name: Charles Stone MRN: [id number] Date of Birth: [birth year]  Transition of Care [facility]) CM/SW Contact  Ronny Bacon, RN Phone Number: 05/11/2023, 11:34 AM  Clinical Narrative:  Secure message received from Palliative Care provider regarding home with hospice set up needed. Phone call attempt made to significant other Mrs. Berton Bon, voicemail left.    1229: Daughter Baker Moronta returned voicemail on behalf of Mrs. Mick Sell. They have not made a full decision on home with hospice and planned to make that determination sometime tomorrow. Daughter is open to receiving information on [facility] and [facility]. Hospice listings and ratings printed out from Medicare.gov and left at bedside to family to view. Pamphlets for various Palliative and Hospice facilities left at bedside for family to review.         Expected Discharge Plan and Services         Expected Discharge Date: 05/07/23                                     Social Determinants of Health (SDOH) Interventions SDOH Screenings   Food Insecurity: No Food Insecurity (05/07/2023)  Housing: Low Risk  (05/07/2023)  Transportation Needs: No Transportation Needs (05/07/2023)  Utilities: Not At Risk (05/07/2023)  Financial Resource Strain: Patient Declined (03/18/2023)   Received from [facility]  Physical Activity: Unknown (03/18/2023)   Received from [facility]  Social Connections: Unknown (12/10/2021)   Received from [facility], [facility]  Stress: Patient Declined (03/18/2023)   Received from [facility]  Tobacco Use: Low Risk  (05/07/2023)    Readmission Risk Interventions    05/08/2023    4:18 PM  Readmission Risk Prevention Plan  Transportation Screening Complete  PCP or Specialist Appt within 5-7 Days Complete  Medication Review (RN CM) Referral to Pharmacy

## 2023-05-11 NOTE — Progress Notes (Signed)
NAME:  Charles Stone, MRN:  [id number], DOB:  [birth year], LOS: 4 ADMISSION DATE:  05-23-23, CONSULTATION DATE:  10/10 REFERRING MD:  Jacqulyn Bath, CHIEF COMPLAINT:  Hepatic encephalopathy   History of Present Illness:  Charles Stone is a 57yo M w/ hx of cirrhosis, alcohol use disorder, CKD stage3, anemia, thrombocytopenia who was admitted 10/9 for 2wks of confusion, found to have hepatic encephalopathy. Pt was being treated w/ lactulose,  but then became acutely obtunded ON and was sent to ICU.  Pertinent  Medical History  Decompensated liver cirrhosis, hx of alcohol use disorder (last drink August), CKD3, anemia, thrombocytopenia  Significant Hospital Events: Including procedures, antibiotic start and stop dates in addition to other pertinent events   10/9 Admitted for hepatic encephalopathy to [facility] 10/10 Acutely obtunded, transfer to ICU 10/11 continues to intermittently require vasopressor support, blood cultures positive for E. coli bacteremia.  Very obtunded this a.m. 10/12 continued issues with coagulopathy overnight with INR back to 4.0, hemoglobin 6.9, renal function also worsened with creatinine up to 4.10 10/12-transition to comfort measures  Interim History / Subjective:    Objective   Blood pressure 124/63, pulse 87, temperature 98.3 F (36.8 C), temperature source Oral, resp. rate 19, weight 97.8 kg, SpO2 100%.        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 coli bacteremia Acute kidney injury on chronic kidney disease Chronic anemia Anion gap metabolic acidosis  Following discussions with family Decision made to make patient comfortable  Patient can be allowed to taking orally, eat, drink liberally  There is the possibility that patient may be able to go home with hospice This was discussed with the family today and they are on board with the possibility  Transition of care request placed  Virl Diamond, MD [place] [facility] Pager: See Loretha Stapler

## 2023-05-11 NOTE — Progress Notes (Signed)
Patient wincing, grimacing and guarding abdomen. Family was concerned that current PRN pain control measures were not effective for patients pain management. Attending MD made aware and orders put in for continuous IV morphine drip and family agreed. Continuous morphine drip started on patient Will continue to ensure patient comfort.

## 2023-05-12 DIAGNOSIS — K7682 Hepatic encephalopathy: Secondary | ICD-10-CM | POA: Diagnosis not present

## 2023-05-12 DIAGNOSIS — B9620 Unspecified Escherichia coli [E. coli] as the cause of diseases classified elsewhere: Secondary | ICD-10-CM

## 2023-05-12 DIAGNOSIS — R7881 Bacteremia: Secondary | ICD-10-CM

## 2023-05-12 DIAGNOSIS — K767 Hepatorenal syndrome: Secondary | ICD-10-CM

## 2023-05-12 DIAGNOSIS — D649 Anemia, unspecified: Secondary | ICD-10-CM | POA: Diagnosis not present

## 2023-05-12 DIAGNOSIS — Z515 Encounter for palliative care: Secondary | ICD-10-CM | POA: Diagnosis not present

## 2023-05-12 MED ORDER — GLYCOPYRROLATE 0.2 MG/ML IJ SOLN: 0.2000 mg | INTRAMUSCULAR | Status: DC | PRN

## 2023-05-12 MED ORDER — HALOPERIDOL 0.5 MG PO TABS: 0.5000 mg | ORAL_TABLET | ORAL | Status: DC | PRN

## 2023-05-12 MED ORDER — LORAZEPAM 1 MG PO TABS: 1.0000 mg | ORAL_TABLET | ORAL | Status: DC | PRN

## 2023-05-12 MED ORDER — GLYCOPYRROLATE 1 MG PO TABS: 1.0000 mg | ORAL_TABLET | ORAL | Status: DC | PRN

## 2023-05-12 MED ORDER — HALOPERIDOL LACTATE 5 MG/ML IJ SOLN: 0.5000 mg | INTRAMUSCULAR | Status: DC | PRN

## 2023-05-12 MED ORDER — LORAZEPAM 2 MG/ML PO CONC: 1.0000 mg | ORAL | Status: DC | PRN

## 2023-05-12 MED ORDER — GLYCOPYRROLATE 0.2 MG/ML IJ SOLN
0.2000 mg | INTRAMUSCULAR | Status: DC | PRN
  Administered 2023-05-13: 0.2 mg via INTRAVENOUS
  Filled 2023-05-12: qty 1

## 2023-05-12 MED ORDER — LORAZEPAM 2 MG/ML IJ SOLN
1.0000 mg | INTRAMUSCULAR | Status: DC | PRN
  Administered 2023-05-12: 1 mg via INTRAVENOUS
  Filled 2023-05-12: qty 1

## 2023-05-12 MED ORDER — HALOPERIDOL LACTATE 2 MG/ML PO CONC: 0.5000 mg | ORAL | Status: DC | PRN

## 2023-05-12 MED ORDER — MORPHINE BOLUS VIA INFUSION: 4.0000 mg | INTRAVENOUS | Status: DC | PRN

## 2023-05-12 NOTE — Progress Notes (Signed)
Nutrition Brief Note  Chart reviewed. Pt now transitioning to comfort care.  No further nutrition interventions planned at this time.  Please re-consult as needed.   Cammy Copa., RD, LDN, CNSC See AMiON for contact information

## 2023-05-12 NOTE — Progress Notes (Addendum)
  Referral received for information on hospice care received yesterday afternoon.   0930am today. TC to the pt's daughter to introduce hospice care and options with home verses inpatient facility. Left VM for Daughter Edgardo Roys to return call for meeting time.   1100am TC to the SO to see if I could meet with her and the pt's family to discuss hospice care and options. Left VM for return call.   Notified by NP with PC that the pt is felt to be to unstable to move and expects hospital death at this time. Will allow family to have there time. Please re-consult if needed.  Thank you for this referral.   Norm Parcel RN [phone]

## 2023-05-12 NOTE — Progress Notes (Signed)
   05/12/23 1341  Mobility  Activity Turned to right side;Turned to left side  Level of Assistance Total care (+2)  Assistive Device None  Range of Motion/Exercises All extremities (extremities lifted for bathing care)  Activity Response Tolerated fair  Mobility Referral Yes  $Mobility charge 1 Mobility  Mobility Specialist Start Time (ACUTE ONLY) 1310  Mobility Specialist Stop Time (ACUTE ONLY) 1340  Mobility Specialist Time Calculation (min) (ACUTE ONLY) 30 min   Mobility Specialist: Progress Note  MS assisted NT with bathing care - pt received in bed. Required total care assist throughout rolling L and R with no AD. Facial grimaces shown when rolled to the right. Left supine in bed with all needs met - call bell within reach. Family present. Small bleeding present on face - RN notified.   Pt was alert at times during session, responded when name was called by lifting his head and opening his eyes.  Barnie Mort, BS Mobility Specialist Please contact via SecureChat or Rehab office at [phone].

## 2023-05-12 NOTE — Progress Notes (Signed)
PROGRESS NOTE    Charles Stone  WUJ:[id number] DOB: 05/09/1966 DOA: 06-01-23 PCP: April Manson, NP   Brief Narrative:  57 year old with alcohol cirrhosis, CKD stage III, anemia, thrombocytopenia admitted on October night for 2 weeks of confusion.  Patient was found to have hepatic encephalopathy which was treated with lactulose but eventually transferred to the ICU on 10/10 as patient became obtunded.  Found to have E. coli bacteremia requiring pressors.  Worsening creatinine and eventually patient was transferred into comfort care.   Assessment & Plan:  Principal Problem:   Symptomatic anemia Active Problems:   Hepatic encephalopathy (HCC)    Decompensated alcohol liver cirrhosis Hepatic encephalopathy Hepatorenal syndrome E. coli bacteremia  -Due to worsening patient's condition, patient was transition to comfort care while in the ICU.  Currently on morphine drip    Patient is now comfort care Status is: Inpatient Remains inpatient appropriate because: Hospice team following. Family at Bedside   Subjective: Currently on morphine drip, comfortable   Examination:  General exam: Appears calm and comfortable  Respiratory system: b/l mild diffuse rhonchi Cardiovascular system:+ systolic murmur Gastrointestinal system: Abdomen is nondistended, soft and nontender. No organomegaly or masses felt. Normal bowel sounds heard. Central nervous system:unable to assess, sedated on morphine.  Skin: No rashes, lesions or ulcers Psychiatry: Unable to assess     Objective: Vitals:   05/10/23 2037 05/11/23 0801 05/12/23 0349 05/12/23 0732  BP: 131/66 124/63 (!) 159/78 (!) 159/78  Pulse: 89 87 98 98  Resp:  19 (!) 24 (!) 24  Temp: 98.7 F (37.1 C) 98.3 F (36.8 C) 97.9 F (36.6 C) 97.9 F (36.6 C)  TempSrc: Oral Oral Oral Oral  SpO2: 96% 100% 97%   Weight:      Height:    5\' 10"  (1.778 m)   No intake or output data in the 24 hours ending 05/12/23 0832 Filed Weights    05/08/23 0900 05/09/23 0500 05/10/23 0500  Weight: 97.6 kg 99.2 kg 97.8 kg    Scheduled Meds:  sodium chloride   Intravenous Once   Chlorhexidine Gluconate Cloth  6 each Topical Daily   mouth rinse  15 mL Mouth Rinse 4 times per day   Continuous Infusions:  morphine 2 mg/hr (05/11/23 1808)    Nutritional status Signs/Symptoms: NPO status Interventions: Tube feeding, Refer to RD note for recommendations Body mass index is 30.94 kg/m.  Data Reviewed:   CBC: Recent Labs  Lab 06/01/23 1613 05/07/23 0406 05/08/23 0815 05/09/23 0340 05/09/23 0841 05/09/23 1506 05/09/23 1902 05/10/23 0743  WBC 8.6 8.8 32.6* 23.9*  --   --   --  17.5*  NEUTROABS  --  6.4  --  21.1*  --   --   --   --   HGB 6.7* 7.2* 7.0* 6.1* 6.8* 7.7* 7.9* 6.9*  HCT 20.1* 21.4* 21.2* 18.0* 20.0* 22.9* 22.5* 20.3*  MCV 130.5* 125.1* 129.3* 129.5*  --   --   --  116.0*  PLT 72* 71* 67* 61*  --   --   --  58*   Basic Metabolic Panel: Recent Labs  Lab 05/07/23 0406 05/08/23 0719 05/08/23 0815 05/09/23 0340 05/09/23 1754 05/10/23 0608  NA 132* 130* 130* 134*  --  136  K 4.4 4.7 4.6 5.1  --  4.6  CL 101 98 98 101  --  102  CO2 23 13* 14* 19*  --  21*  GLUCOSE 115* 112* 118* 135*  --  156*  BUN  27* 36* 36* 46*  --  75*  CREATININE 1.59* 2.59* 2.72* 2.92*  --  4.10*  CALCIUM 9.3 9.0 9.0 9.4  --  9.3  MG 1.5*  --  1.5* 2.5* 2.7* 2.9*  PHOS 3.5  --   --  6.5* 6.8* 5.5*   GFR: Estimated Creatinine Clearance: 22.7 mL/min (A) (by C-G formula based on SCr of 4.1 mg/dL (H)). Liver Function Tests: Recent Labs  Lab 05/07/23 0406 05/08/23 0719 05/08/23 0815 05/09/23 0340 05/10/23 0608  AST 58* 66* 62* 54* 49*  ALT 31 31 32 31 24  ALKPHOS 133* 99 94 69 65  BILITOT 15.3* 18.8* 19.3* 22.9* 19.4*  PROT 6.5 6.2* 6.4* 6.6 7.2  ALBUMIN 1.7* 1.7* 1.7* 2.9* 3.5   Recent Labs  Lab 05/24/2023 1613  LIPASE 26   Recent Labs  Lab 05/14/2023 2026 05/08/23 0815 05/09/23 0340  AMMONIA 67* 36* 25    Coagulation Profile: Recent Labs  Lab 05/07/23 0406 05/08/23 0719 05/08/23 1610 05/09/23 0340 05/09/23 1902  INR 3.2* 4.4* 2.7* 4.0* 4.2*   Cardiac Enzymes: No results for input(s): "CKTOTAL", "CKMB", "CKMBINDEX", "TROPONINI" in the last 168 hours. BNP (last 3 results) No results for input(s): "PROBNP" in the last 8760 hours. HbA1C: No results for input(s): "HGBA1C" in the last 72 hours. CBG: Recent Labs  Lab 05/09/23 1924 05/09/23 2333 05/10/23 0339 05/10/23 0715 05/10/23 1111  GLUCAP 133* 152* 134* 140* 157*   Lipid Profile: No results for input(s): "CHOL", "HDL", "LDLCALC", "TRIG", "CHOLHDL", "LDLDIRECT" in the last 72 hours. Thyroid Function Tests: No results for input(s): "TSH", "T4TOTAL", "FREET4", "T3FREE", "THYROIDAB" in the last 72 hours. Anemia Panel: No results for input(s): "VITAMINB12", "FOLATE", "FERRITIN", "TIBC", "IRON", "RETICCTPCT" in the last 72 hours. Sepsis Labs: Recent Labs  Lab 05/08/23 0719 05/08/23 0815 05/09/23 0841  LATICACIDVEN >9.0* >9.0* 4.5*    Recent Results (from the past 240 hour(s))  Culture, blood (Routine X 2) w Reflex to ID Panel     Status: Abnormal   Collection Time: 05/08/23  8:15 AM   Specimen: BLOOD RIGHT ARM  Result Value Ref Range Status   Specimen Description BLOOD RIGHT ARM  Final   Special Requests   Final    BOTTLES DRAWN AEROBIC AND ANAEROBIC Blood Culture adequate volume   Culture  Setup Time   Final    GRAM NEGATIVE RODS AEROBIC BOTTLE ONLY CRITICAL RESULT CALLED TO, READ BACK BY AND VERIFIED WITH: PHARMD CARLA JARDIN ON 05/08/23 @ 2216 BY DRT Performed at [facility], [address], [place], [place] [address]    Culture ESCHERICHIA COLI (A)  Final   Report Status 05/10/2023 FINAL  Final   Organism ID, Bacteria ESCHERICHIA COLI  Final      Susceptibility   Escherichia coli - MIC*    AMPICILLIN 4 SENSITIVE Sensitive     CEFEPIME <=0.12 SENSITIVE Sensitive     CEFTAZIDIME <=1 SENSITIVE  Sensitive     CEFTRIAXONE <=0.25 SENSITIVE Sensitive     CIPROFLOXACIN <=0.25 SENSITIVE Sensitive     GENTAMICIN <=1 SENSITIVE Sensitive     IMIPENEM <=0.25 SENSITIVE Sensitive     TRIMETH/SULFA <=20 SENSITIVE Sensitive     AMPICILLIN/SULBACTAM <=2 SENSITIVE Sensitive     PIP/TAZO <=4 SENSITIVE Sensitive ug/mL    * ESCHERICHIA COLI  Culture, blood (Routine X 2) w Reflex to ID Panel     Status: Abnormal   Collection Time: 05/08/23  8:15 AM   Specimen: BLOOD RIGHT ARM  Result Value  Ref Range Status   Specimen Description BLOOD RIGHT ARM  Final   Special Requests   Final    BOTTLES DRAWN AEROBIC AND ANAEROBIC Blood Culture adequate volume   Culture  Setup Time   Final    GRAM NEGATIVE RODS AEROBIC BOTTLE ONLY CRITICAL RESULT CALLED TO, READ BACK BY AND VERIFIED WITH: PHARMD CARLA JARDIN ON 05/08/23 @ 2216 BY DRT    Culture (A)  Final    ESCHERICHIA COLI SUSCEPTIBILITIES PERFORMED ON PREVIOUS CULTURE WITHIN THE LAST 5 DAYS. Performed at [facility], [address], [place], [place] [address]    Report Status 05/10/2023 FINAL  Final  Blood Culture ID Panel (Reflexed)     Status: Abnormal   Collection Time: 05/08/23  8:15 AM  Result Value Ref Range Status   Enterococcus faecalis NOT DETECTED NOT DETECTED Final   Enterococcus Faecium NOT DETECTED NOT DETECTED Final   Listeria monocytogenes NOT DETECTED NOT DETECTED Final   Staphylococcus species NOT DETECTED NOT DETECTED Final   Staphylococcus aureus (BCID) NOT DETECTED NOT DETECTED Final   Staphylococcus epidermidis NOT DETECTED NOT DETECTED Final   Staphylococcus lugdunensis NOT DETECTED NOT DETECTED Final   Streptococcus species NOT DETECTED NOT DETECTED Final   Streptococcus agalactiae NOT DETECTED NOT DETECTED Final   Streptococcus pneumoniae NOT DETECTED NOT DETECTED Final   Streptococcus pyogenes NOT DETECTED NOT DETECTED Final   A.calcoaceticus-baumannii NOT DETECTED NOT DETECTED Final   Bacteroides fragilis NOT  DETECTED NOT DETECTED Final   Enterobacterales DETECTED (A) NOT DETECTED Final    Comment: Enterobacterales represent a large order of gram negative bacteria, not a single organism. CRITICAL RESULT CALLED TO, READ BACK BY AND VERIFIED WITH: PHARMD CARLA JARDIN ON 05/08/23 @ 2216 BY DRT    Enterobacter cloacae complex NOT DETECTED NOT DETECTED Final   Escherichia coli DETECTED (A) NOT DETECTED Final    Comment: CRITICAL RESULT CALLED TO, READ BACK BY AND VERIFIED WITH: PHARMD CARLA JARDIN ON 05/08/23 @ 2216 BY DRT    Klebsiella aerogenes NOT DETECTED NOT DETECTED Final   Klebsiella oxytoca NOT DETECTED NOT DETECTED Final   Klebsiella pneumoniae NOT DETECTED NOT DETECTED Final   Proteus species NOT DETECTED NOT DETECTED Final   Salmonella species NOT DETECTED NOT DETECTED Final   Serratia marcescens NOT DETECTED NOT DETECTED Final   Haemophilus influenzae NOT DETECTED NOT DETECTED Final   Neisseria meningitidis NOT DETECTED NOT DETECTED Final   Pseudomonas aeruginosa NOT DETECTED NOT DETECTED Final   Stenotrophomonas maltophilia NOT DETECTED NOT DETECTED Final   Candida albicans NOT DETECTED NOT DETECTED Final   Candida auris NOT DETECTED NOT DETECTED Final   Candida glabrata NOT DETECTED NOT DETECTED Final   Candida krusei NOT DETECTED NOT DETECTED Final   Candida parapsilosis NOT DETECTED NOT DETECTED Final   Candida tropicalis NOT DETECTED NOT DETECTED Final   Cryptococcus neoformans/gattii NOT DETECTED NOT DETECTED Final   CTX-M ESBL NOT DETECTED NOT DETECTED Final   Carbapenem resistance IMP NOT DETECTED NOT DETECTED Final   Carbapenem resistance KPC NOT DETECTED NOT DETECTED Final   Carbapenem resistance NDM NOT DETECTED NOT DETECTED Final   Carbapenem resist OXA 48 LIKE NOT DETECTED NOT DETECTED Final   Carbapenem resistance VIM NOT DETECTED NOT DETECTED Final    Comment: Performed at [facility], [address], [place], [place] [address]         Radiology  Studies: No results found.         LOS: 5 days  Time spent= 35 mins    Miguel Rota, MD [facility]  If 7PM-7AM, please contact night-coverage  05/12/2023, 8:32 AM

## 2023-05-12 NOTE — TOC Progression Note (Signed)
Transition of Care (TOC) - Progression Note   Notified by Palliative Care Medicine Team , patient not stable for transfer at this time. Anticipated hospital death  Patient Details  Name: Charles Stone MRN: [id number] Date of Birth: [birth year]  Transition of [facility]) CM/SW Contact  Ki Corbo, Adria Devon, RN Phone Number: 05/12/2023, 3:55 PM  Clinical Narrative:            Expected Discharge Plan and Services         Expected Discharge Date: 05/07/23                                     Social Determinants of Health (SDOH) Interventions SDOH Screenings   Food Insecurity: No Food Insecurity (05/07/2023)  Housing: Low Risk  (05/07/2023)  Transportation Needs: No Transportation Needs (05/07/2023)  Utilities: Not At Risk (05/07/2023)  Financial Resource Strain: Patient Declined (03/18/2023)   Received from [facility]  Physical Activity: Unknown (03/18/2023)   Received from [facility]  Social Connections: Unknown (12/10/2021)   Received from [facility]  Stress: Patient Declined (03/18/2023)   Received from [facility]  Tobacco Use: Low Risk  (05/07/2023)    Readmission Risk Interventions    05/08/2023    4:18 PM  Readmission Risk Prevention Plan  Transportation Screening Complete  PCP or Specialist Appt within 5-7 Days Complete  Medication Review (RN CM) Referral to Pharmacy

## 2023-05-12 NOTE — Hospital Course (Signed)
  Brief Narrative:  57 year old with alcohol cirrhosis, CKD stage III, anemia, thrombocytopenia admitted on October night for 2 weeks of confusion.  Patient was found to have hepatic encephalopathy which was treated with lactulose but eventually transferred to the ICU on 10/10 as patient became obtunded.  Found to have E. coli bacteremia requiring pressors.  Worsening creatinine and eventually patient was transferred into comfort care.     Assessment & Plan:  Principal Problem:   Symptomatic anemia Active Problems:   Hepatic encephalopathy (HCC)     Decompensated alcohol liver cirrhosis Hepatic encephalopathy Hepatorenal syndrome E. coli bacteremia   - Patient remains comfortable on morphine drip.  Anticipate possible in-hospital death therefore we will keep him in the hospital for now       Patient is now comfort care Status is: Inpatient Remains inpatient appropriate because: Hospice team following. Children present at bedside

## 2023-05-12 NOTE — Plan of Care (Signed)

## 2023-05-12 NOTE — Consult Note (Signed)
Consultation Note Date: 05/12/2023   Patient Name: Charles Stone  DOB: 1966/02/05  MRN: [id number]  Age / Sex: 57 y.o., male  PCP: April Manson, NP Referring Physician: Miguel Rota, MD  Reason for Consultation:  liver failure, advanced cirrhosis, AKI- transition to home hospice  HPI/Patient Profile: 57 y.o. male  with past medical history of alcoholic cirrhosis,  CKD III, anemia, thrombocytopenia, admitted on 15-May-2023 with confusion ongoing for 2 weeks. Workup revealed hepatic encephalopathy, treated with lactulose but confusion persisted, he eventually developed hepatorenal failure, and was found to have E. Coli bacteremia and in septic shock. Per discussion with attending team, patient was transitioned to comfort measures. Palliative consulted for end of life care and assistance with hospice coordination.    Primary Decision Maker NEXT OF KIN - daughters Edgardo Roys and Hinda Kehr, son, Marlene Bast  Discussion: Chart reviewed including labs, progress notes, imaging from this and previous encounters.  Patient's SO and children were at bedside.  Emotional support provided.  Answered questions relating to symptom management and dying process.  Patient having episodes of apnea, nasolabial flattening, unresponsive- he is actively dying.  Discussed hospice facility vs inpatient death. I would be concerned for death during transport and recommended not moving him to hospice facility. Family pleased with this as they were also hoping he could stay in the hospital.      SUMMARY OF RECOMMENDATIONS -Continue current interventions -Added bolus dosing from morphine infusion - 4mg  IV q15 min prn SOB or any discomfort or distress -Patient is not stable for transport out of facility   Code Status/Advance Care Planning: DNR   Prognosis:   Hours - Days  Discharge Planning: Anticipated Hospital Death  Primary  Diagnoses: Present on Admission:  Symptomatic anemia  Hepatic encephalopathy (HCC)   Review of Systems  Unable to perform ROS: Acuity of condition    Physical Exam Vitals and nursing note reviewed.  Pulmonary:     Comments: Periods of apnea Musculoskeletal:     Comments: Diffuse anasarca  Skin:    General: Skin is warm and dry.     Coloration: Skin is jaundiced.  Neurological:     Comments: unresponsive     Vital Signs: BP (!) 145/69 (BP Location: Right Arm)   Pulse (!) 101   Temp 98.3 F (36.8 C) (Oral)   Resp 16   Ht 5\' 10"  (1.778 m)   Wt 97.8 kg   SpO2 98%   BMI 30.94 kg/m  Pain Scale: CPOT   Pain Score: Asleep   SpO2: SpO2: 98 % O2 Device:SpO2: 98 % O2 Flow Rate: .O2 Flow Rate (L/min): 2 L/min  IO: Intake/output summary: No intake or output data in the 24 hours ending 05/12/23 1227  LBM: Last BM Date : 05/11/23 Baseline Weight: Weight: 97.6 kg Most recent weight: Weight: 97.8 kg       Thank you for this consult. Palliative medicine will continue to follow and assist as needed.  Time Total: 80 minutes Signed by: Ocie Bob, AGNP-C Palliative Medicine  Time includes:   Preparing to see the patient (e.g., review of tests) Obtaining and/or reviewing separately obtained history Performing a medically necessary appropriate examination and/or evaluation Counseling and educating the patient/family/caregiver Ordering medications, tests, or procedures Referring and communicating with other health care professionals (when not reported separately) Documenting clinical information in the electronic or other health record Independently interpreting results (not reported separately) and communicating results to the patient/family/caregiver Care coordination (not reported separately) Clinical documentation   Please contact Palliative Medicine Team phone at [phone] for questions and concerns.  For individual provider: See Loretha Stapler

## 2023-05-13 DIAGNOSIS — K7211 Chronic hepatic failure with coma: Secondary | ICD-10-CM

## 2023-05-13 DIAGNOSIS — R7881 Bacteremia: Secondary | ICD-10-CM | POA: Diagnosis not present

## 2023-05-13 DIAGNOSIS — Z515 Encounter for palliative care: Secondary | ICD-10-CM | POA: Diagnosis not present

## 2023-05-13 DIAGNOSIS — D649 Anemia, unspecified: Secondary | ICD-10-CM | POA: Diagnosis not present

## 2023-05-13 MED ORDER — GLYCOPYRROLATE 0.2 MG/ML IJ SOLN
0.4000 mg | INTRAMUSCULAR | Status: DC
  Administered 2023-05-13: 0.4 mg via INTRAVENOUS
  Filled 2023-05-13: qty 2

## 2023-05-13 MED ORDER — CHLORHEXIDINE GLUCONATE CLOTH 2 % EX PADS
6.0000 | MEDICATED_PAD | Freq: Every day | CUTANEOUS | Status: DC
  Administered 2023-05-13: 6 via TOPICAL

## 2023-05-30 NOTE — Progress Notes (Signed)
PROGRESS NOTE    Charles Stone  ZOX:[id number] DOB: Dec 25, 1965 DOA: 05/15/2023 PCP: April Manson, NP     Brief Narrative:  57 year old with alcohol cirrhosis, CKD stage III, anemia, thrombocytopenia admitted on October night for 2 weeks of confusion.  Patient was found to have hepatic encephalopathy which was treated with lactulose but eventually transferred to the ICU on 10/10 as patient became obtunded.  Found to have E. coli bacteremia requiring pressors.  Worsening creatinine and eventually patient was transferred into comfort care.     Assessment & Plan:  Principal Problem:   Symptomatic anemia Active Problems:   Hepatic encephalopathy (HCC)     Decompensated alcohol liver cirrhosis Hepatic encephalopathy Hepatorenal syndrome E. coli bacteremia   - Patient remains comfortable on morphine drip.  Anticipate possible in-hospital death therefore we will keep him in the hospital for now       Patient is now comfort care Status is: Inpatient Remains inpatient appropriate because: Hospice team following. Children present at bedside             Subjective: Seen at bedside, family is at bedside as well Morphine drip had to be increased overnight   Examination:  General exam: Appears comfortable on morphine drip Respiratory system: Some rhonchi heard anteriorly Cardiovascular system: Systolic murmur Gastrointestinal system: Abdomen is nondistended, soft and nontender. No organomegaly or masses felt. Normal bowel sounds heard. Central nervous system: Unable to assess, morphine drip Extremities: Some swelling noted Skin: No rashes, lesions or ulcers Psychiatry: Unable to assess       Objective: Vitals:   05/12/23 0732 05/12/23 0951 05-25-2023 0446 2023/05/25 0758  BP: (!) 159/78 (!) 145/69 (!) 98/42 (!) 91/41  Pulse: 98 (!) 101 93 92  Resp: (!) 24 16 19 16   Temp: 97.9 F (36.6 C) 98.3 F (36.8 C) 98 F (36.7 C) 98.4 F (36.9 C)  TempSrc: Oral Oral  Axillary Axillary  SpO2:  98%    Weight:      Height: 5\' 10"  (1.778 m)       Intake/Output Summary (Last 24 hours) at 2023/05/25 1255 Last data filed at 05/25/23 0446 Gross per 24 hour  Intake 109.73 ml  Output 725 ml  Net -615.27 ml   Filed Weights   05/08/23 0900 05/09/23 0500 05/10/23 0500  Weight: 97.6 kg 99.2 kg 97.8 kg    Scheduled Meds:  mouth rinse  15 mL Mouth Rinse 4 times per day   Continuous Infusions:  morphine 5 mg/hr (2023/05/25 0140)    Nutritional status Signs/Symptoms: NPO status Interventions: Tube feeding, Refer to RD note for recommendations Body mass index is 30.94 kg/m.  Data Reviewed:   CBC: Recent Labs  Lab 05/20/2023 1613 05/07/23 0406 05/08/23 0815 05/09/23 0340 05/09/23 0841 05/09/23 1506 05/09/23 1902 05/10/23 0743  WBC 8.6 8.8 32.6* 23.9*  --   --   --  17.5*  NEUTROABS  --  6.4  --  21.1*  --   --   --   --   HGB 6.7* 7.2* 7.0* 6.1* 6.8* 7.7* 7.9* 6.9*  HCT 20.1* 21.4* 21.2* 18.0* 20.0* 22.9* 22.5* 20.3*  MCV 130.5* 125.1* 129.3* 129.5*  --   --   --  116.0*  PLT 72* 71* 67* 61*  --   --   --  58*   Basic Metabolic Panel: Recent Labs  Lab 05/07/23 0406 05/08/23 0719 05/08/23 0815 05/09/23 0340 05/09/23 1754 05/10/23 0608  NA 132* 130* 130* 134*  --  136  K 4.4 4.7 4.6 5.1  --  4.6  CL 101 98 98 101  --  102  CO2 23 13* 14* 19*  --  21*  GLUCOSE 115* 112* 118* 135*  --  156*  BUN 27* 36* 36* 46*  --  75*  CREATININE 1.59* 2.59* 2.72* 2.92*  --  4.10*  CALCIUM 9.3 9.0 9.0 9.4  --  9.3  MG 1.5*  --  1.5* 2.5* 2.7* 2.9*  PHOS 3.5  --   --  6.5* 6.8* 5.5*   GFR: Estimated Creatinine Clearance: 22.7 mL/min (A) (by C-G formula based on SCr of 4.1 mg/dL (H)). Liver Function Tests: Recent Labs  Lab 05/07/23 0406 05/08/23 0719 05/08/23 0815 05/09/23 0340 05/10/23 0608  AST 58* 66* 62* 54* 49*  ALT 31 31 32 31 24  ALKPHOS 133* 99 94 69 65  BILITOT 15.3* 18.8* 19.3* 22.9* 19.4*  PROT 6.5 6.2* 6.4* 6.6 7.2  ALBUMIN  1.7* 1.7* 1.7* 2.9* 3.5   Recent Labs  Lab 05/25/23 1613  LIPASE 26   Recent Labs  Lab May 25, 2023 2026 05/08/23 0815 05/09/23 0340  AMMONIA 67* 36* 25   Coagulation Profile: Recent Labs  Lab 05/07/23 0406 05/08/23 0719 05/08/23 1610 05/09/23 0340 05/09/23 1902  INR 3.2* 4.4* 2.7* 4.0* 4.2*   Cardiac Enzymes: No results for input(s): "CKTOTAL", "CKMB", "CKMBINDEX", "TROPONINI" in the last 168 hours. BNP (last 3 results) No results for input(s): "PROBNP" in the last 8760 hours. HbA1C: No results for input(s): "HGBA1C" in the last 72 hours. CBG: Recent Labs  Lab 05/09/23 1924 05/09/23 2333 05/10/23 0339 05/10/23 0715 05/10/23 1111  GLUCAP 133* 152* 134* 140* 157*   Lipid Profile: No results for input(s): "CHOL", "HDL", "LDLCALC", "TRIG", "CHOLHDL", "LDLDIRECT" in the last 72 hours. Thyroid Function Tests: No results for input(s): "TSH", "T4TOTAL", "FREET4", "T3FREE", "THYROIDAB" in the last 72 hours. Anemia Panel: No results for input(s): "VITAMINB12", "FOLATE", "FERRITIN", "TIBC", "IRON", "RETICCTPCT" in the last 72 hours. Sepsis Labs: Recent Labs  Lab 05/08/23 0719 05/08/23 0815 05/09/23 0841  LATICACIDVEN >9.0* >9.0* 4.5*    Recent Results (from the past 240 hour(s))  Culture, blood (Routine X 2) w Reflex to ID Panel     Status: Abnormal   Collection Time: 05/08/23  8:15 AM   Specimen: BLOOD RIGHT ARM  Result Value Ref Range Status   Specimen Description BLOOD RIGHT ARM  Final   Special Requests   Final    BOTTLES DRAWN AEROBIC AND ANAEROBIC Blood Culture adequate volume   Culture  Setup Time   Final    GRAM NEGATIVE RODS AEROBIC BOTTLE ONLY CRITICAL RESULT CALLED TO, READ BACK BY AND VERIFIED WITH: PHARMD CARLA JARDIN ON 05/08/23 @ 2216 BY DRT Performed at [facility], [address] [place], [place] [address]    Culture ESCHERICHIA COLI (A)  Final   Report Status 05/10/2023 FINAL  Final   Organism ID, Bacteria ESCHERICHIA COLI  Final       Susceptibility   Escherichia coli - MIC*    AMPICILLIN 4 SENSITIVE Sensitive     CEFEPIME <=0.12 SENSITIVE Sensitive     CEFTAZIDIME <=1 SENSITIVE Sensitive     CEFTRIAXONE <=0.25 SENSITIVE Sensitive     CIPROFLOXACIN <=0.25 SENSITIVE Sensitive     GENTAMICIN <=1 SENSITIVE Sensitive     IMIPENEM <=0.25 SENSITIVE Sensitive     TRIMETH/SULFA <=20 SENSITIVE Sensitive     AMPICILLIN/SULBACTAM <=2 SENSITIVE Sensitive     PIP/TAZO <=4  SENSITIVE Sensitive ug/mL    * ESCHERICHIA COLI  Culture, blood (Routine X 2) w Reflex to ID Panel     Status: Abnormal   Collection Time: 05/08/23  8:15 AM   Specimen: BLOOD RIGHT ARM  Result Value Ref Range Status   Specimen Description BLOOD RIGHT ARM  Final   Special Requests   Final    BOTTLES DRAWN AEROBIC AND ANAEROBIC Blood Culture adequate volume   Culture  Setup Time   Final    GRAM NEGATIVE RODS AEROBIC BOTTLE ONLY CRITICAL RESULT CALLED TO, READ BACK BY AND VERIFIED WITH: PHARMD CARLA JARDIN ON 05/08/23 @ 2216 BY DRT    Culture (A)  Final    ESCHERICHIA COLI SUSCEPTIBILITIES PERFORMED ON PREVIOUS CULTURE WITHIN THE LAST 5 DAYS. Performed at [facility], [address], [place], [place] [address]    Report Status 05/10/2023 FINAL  Final  Blood Culture ID Panel (Reflexed)     Status: Abnormal   Collection Time: 05/08/23  8:15 AM  Result Value Ref Range Status   Enterococcus faecalis NOT DETECTED NOT DETECTED Final   Enterococcus Faecium NOT DETECTED NOT DETECTED Final   Listeria monocytogenes NOT DETECTED NOT DETECTED Final   Staphylococcus species NOT DETECTED NOT DETECTED Final   Staphylococcus aureus (BCID) NOT DETECTED NOT DETECTED Final   Staphylococcus epidermidis NOT DETECTED NOT DETECTED Final   Staphylococcus lugdunensis NOT DETECTED NOT DETECTED Final   Streptococcus species NOT DETECTED NOT DETECTED Final   Streptococcus agalactiae NOT DETECTED NOT DETECTED Final   Streptococcus pneumoniae NOT DETECTED NOT DETECTED Final    Streptococcus pyogenes NOT DETECTED NOT DETECTED Final   A.calcoaceticus-baumannii NOT DETECTED NOT DETECTED Final   Bacteroides fragilis NOT DETECTED NOT DETECTED Final   Enterobacterales DETECTED (A) NOT DETECTED Final    Comment: Enterobacterales represent a large order of gram negative bacteria, not a single organism. CRITICAL RESULT CALLED TO, READ BACK BY AND VERIFIED WITH: PHARMD CARLA JARDIN ON 05/08/23 @ 2216 BY DRT    Enterobacter cloacae complex NOT DETECTED NOT DETECTED Final   Escherichia coli DETECTED (A) NOT DETECTED Final    Comment: CRITICAL RESULT CALLED TO, READ BACK BY AND VERIFIED WITH: PHARMD CARLA JARDIN ON 05/08/23 @ 2216 BY DRT    Klebsiella aerogenes NOT DETECTED NOT DETECTED Final   Klebsiella oxytoca NOT DETECTED NOT DETECTED Final   Klebsiella pneumoniae NOT DETECTED NOT DETECTED Final   Proteus species NOT DETECTED NOT DETECTED Final   Salmonella species NOT DETECTED NOT DETECTED Final   Serratia marcescens NOT DETECTED NOT DETECTED Final   Haemophilus influenzae NOT DETECTED NOT DETECTED Final   Neisseria meningitidis NOT DETECTED NOT DETECTED Final   Pseudomonas aeruginosa NOT DETECTED NOT DETECTED Final   Stenotrophomonas maltophilia NOT DETECTED NOT DETECTED Final   Candida albicans NOT DETECTED NOT DETECTED Final   Candida auris NOT DETECTED NOT DETECTED Final   Candida glabrata NOT DETECTED NOT DETECTED Final   Candida krusei NOT DETECTED NOT DETECTED Final   Candida parapsilosis NOT DETECTED NOT DETECTED Final   Candida tropicalis NOT DETECTED NOT DETECTED Final   Cryptococcus neoformans/gattii NOT DETECTED NOT DETECTED Final   CTX-M ESBL NOT DETECTED NOT DETECTED Final   Carbapenem resistance IMP NOT DETECTED NOT DETECTED Final   Carbapenem resistance KPC NOT DETECTED NOT DETECTED Final   Carbapenem resistance NDM NOT DETECTED NOT DETECTED Final   Carbapenem resist OXA 48 LIKE NOT DETECTED NOT DETECTED Final   Carbapenem resistance VIM NOT  DETECTED NOT  DETECTED Final    Comment: Performed at [facility], [address], [place], [place] [address]         Radiology Studies: No results found.         LOS: 6 days   Time spent= 35 mins    Miguel Rota, MD [facility]  If 7PM-7AM, please contact night-coverage  05-30-23, 12:55 PM

## 2023-05-30 NOTE — Progress Notes (Signed)
Daily Progress Note   Patient Name: Charles Stone       Date: June 08, 2023 DOB: 12/20/1965  Age: 57 y.o. MRN#: [id number] Attending Physician: Miguel Rota, MD Primary Care Physician: April Manson, NP Admit Date: 05/02/2023  Reason for Consultation/Follow-up: Terminal Care  Patient Profile/HPI: 57 y.o. male  with past medical history of alcoholic cirrhosis,  CKD III, anemia, thrombocytopenia, admitted on 05/03/2023 with confusion ongoing for 2 weeks. Workup revealed hepatic encephalopathy, treated with lactulose but confusion persisted, he eventually developed hepatorenal failure, and was found to have E. Coli bacteremia and in septic shock. Per discussion with attending team, patient was transitioned to comfort measures. Palliative consulted for end of life care and assistance with hospice coordination.   Subjective: Chart reviewed including labs, progress notes, imaging from this and previous encounters.  Actively dying.  Unresponsive.  Audible terminal secretions. Family at bedside. Answered questions about symptom control.  Review of Systems  Unable to perform ROS: Acuity of condition (Actively dying)     Physical Exam Vitals and nursing note reviewed.  Constitutional:      Appearance: He is ill-appearing.  Cardiovascular:     Rate and Rhythm: Normal rate.  Pulmonary:     Comments: Audible terminal secretions Musculoskeletal:     Comments: Diffuse anasarca  Skin:    Coloration: Skin is jaundiced.  Neurological:     Comments: Unresponsive             Vital Signs: BP (!) 91/41 (BP Location: Right Arm)   Pulse 92   Temp 98.4 F (36.9 C) (Axillary)   Resp 16   Ht 5\' 10"  (1.778 m)   Wt 97.8 kg   SpO2 98%   BMI 30.94 kg/m  SpO2: SpO2: 98 % O2 Device: O2 Device: Room  Air O2 Flow Rate: O2 Flow Rate (L/min): 2 L/min  Intake/output summary:  Intake/Output Summary (Last 24 hours) at 2023/06/08 1423 Last data filed at Jun 08, 2023 0446 Gross per 24 hour  Intake 109.73 ml  Output 725 ml  Net -615.27 ml   LBM: Last BM Date : 05/11/23 Baseline Weight: Weight: 97.6 kg Most recent weight: Weight: 97.8 kg       Palliative Assessment/Data: PPS: 10%      Patient Active Problem List   Diagnosis Date Noted   Hepatic  encephalopathy (HCC) 05/07/2023   Symptomatic anemia 05/18/23   Acute pancreatitis 05/01/2018   Hypokalemia 05/01/2018   Alcohol use 05/01/2018   Chest pain 08/06/2013    Palliative Care Assessment & Plan    Assessment/Recommendations/Plan  Actively dying-comfort measures only Scheduled Robinul 0.4 mg every 4 hours IV Continue other comfort measures as ordered   Code Status: DNR  Prognosis:  Hours - Days  Discharge Planning: Anticipated Hospital Death  Care plan was discussed with family and care team.   Thank you for allowing the Palliative Medicine Team to assist in the care of this patient.  Preparing to see the patient (e.g., review of tests) Obtaining and/or reviewing separately obtained history Performing a medically necessary appropriate examination and/or evaluation Counseling and educating the patient/family/caregiver Ordering medications, tests, or procedures Referring and communicating with other health care professionals (when not reported separately) Documenting clinical information in the electronic or other health record Independently interpreting results (not reported separately) and communicating results to the patient/family/caregiver Care coordination (not reported separately) Clinical documentation  Ocie Bob, AGNP-C Palliative Medicine   Please contact Palliative Medicine Team phone at [phone] for questions and concerns.

## 2023-05-30 NOTE — Death Summary Note (Signed)
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   Hospital Diagnoses: Principal Problem:   Symptomatic anemia Active Problems:   Hepatic encephalopathy [facility])     Brief Narrative:  57 year old with alcohol cirrhosis, CKD stage III, anemia, thrombocytopenia admitted on October night for 2 weeks of confusion.  Patient was found to have hepatic encephalopathy which was treated with lactulose but eventually transferred to the ICU on 10/10 as patient became obtunded.  Found to have E. coli bacteremia requiring pressors.  Worsening creatinine and eventually patient was transferred into comfort care. Eventually deceased on the above dated/time.      Assessment & Plan:  Principal Problem:   Symptomatic anemia Active Problems:   Hepatic encephalopathy (HCC)     Decompensated alcohol liver cirrhosis Hepatic encephalopathy Hepatorenal syndrome E. coli bacteremia   - Was on morphine drip. Eventually deceased on 06-10-23       Patient is now comfort care Status is: Inpatient Remains inpatient appropriate because: Hospice team following. Children present at bedside     The results of significant diagnostics from this hospitalization (including imaging, microbiology, ancillary and laboratory) are listed below for reference.   Significant Diagnostic Studies: DG Abd Portable 1V  Result Date: 05/09/2023 CLINICAL DATA:  Feeding tube EXAM: PORTABLE ABDOMEN - 1 VIEW COMPARISON:  Abdominal x-ray 05/08/2023 FINDINGS: Feeding tube tip is at the level of the gastric antrum. IMPRESSION: Feeding tube tip is at the level of the gastric antrum. Electronically Signed   By: Darliss Cheney M.D.   On: 05/09/2023 19:01   CT  CHEST ABDOMEN PELVIS W CONTRAST  Result Date: 05/08/2023 CLINICAL DATA:  Inpatient.  Sepsis.  Abnormal labs. EXAM: CT CHEST, ABDOMEN, AND PELVIS WITH CONTRAST TECHNIQUE: Multidetector CT imaging of the chest, abdomen and pelvis was performed following the standard protocol during bolus administration of intravenous contrast. RADIATION DOSE REDUCTION: This exam was performed according to the departmental dose-optimization program which includes automated exposure control, adjustment of the mA and/or kV according to patient size and/or use of iterative reconstruction technique. CONTRAST:  75mL OMNIPAQUE IOHEXOL 350 MG/ML SOLN COMPARISON:  1624 coronary CT. 04/01/2021 pelvis CT. 05/01/2018 CT abdomen/pelvis. 10/01/2008 chest CT. FINDINGS: CT CHEST FINDINGS Cardiovascular: Borderline mild cardiomegaly. No significant pericardial effusion/thickening. Mildly atherosclerotic nonaneurysmal thoracic aorta. Normal caliber pulmonary arteries. No central pulmonary emboli. Mediastinum/Nodes: No significant thyroid nodules. Large lower paraesophageal varices. Otherwise normal esophagus. No pathologically enlarged axillary, mediastinal or hilar lymph nodes. Lungs/Pleura: No pneumothorax. Small left and trace right layering pleural effusions. Moderate left and mild right passive lower lobe atelectasis. No lung masses or significant pulmonary nodules in the aerated portions of the lungs. Musculoskeletal: No aggressive appearing focal osseous lesions. Mild thoracic spondylosis. Mild anasarca. Moderate symmetric bilateral gynecomastia. CT ABDOMEN PELVIS FINDINGS Hepatobiliary: Atrophic liver with diffusely irregular liver surface, compatible with cirrhosis. No liver masses. No radiopaque cholelithiasis. Mild diffuse gallbladder wall thickening. No gallbladder distention. No biliary ductal dilatation. Pancreas: Normal, with no mass or duct dilation. Spleen: Mild splenomegaly. Craniocaudal splenic length 14.2 cm. No splenic masses.  Adrenals/Urinary Tract: Normal adrenals. No renal masses. No hydronephrosis. Normal bladder. Stomach/Bowel: Small hiatal hernia. Otherwise normal nondistended stomach. Normal caliber small bowel with no small bowel wall thickening.  Appendix not discretely visualized. Normal large bowel with no diverticulosis, large bowel wall thickening or pericolonic fat stranding. Vascular/Lymphatic: Atherosclerotic nonaneurysmal abdominal aorta. Patent portal, splenic, hepatic and renal veins. Large upper retroperitoneal portosystemic varices between the portal and left renal veins. No pathologically enlarged lymph nodes in the abdomen or pelvis. Reproductive: Normal size prostate. Other: No pneumoperitoneum. Small volume ascites. Ill-defined presacral space fluid and fat stranding. Mild anasarca. Musculoskeletal: No aggressive appearing focal osseous lesions. Mild lumbar spondylosis. IMPRESSION: 1. Cirrhosis. No liver masses. 2. Mild splenomegaly. 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 3. Evidence of fluid third spacing. Mild anasarca. Small volume ascites. Small left and trace right layering pleural effusions. 4. Nonspecific mild gallbladder wall thickening, favor noninflammatory edema. 5. Borderline mild cardiomegaly. 6. Small hiatal hernia. 7.  Aortic Atherosclerosis (ICD10-I70.0). Electronically Signed   By: Delbert Phenix M.D.   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 EXAM: ABDOMEN - 1 VIEW COMPARISON:  Head CT 0040 hours today. CT Abdomen and Pelvis 04/01/2021. FINDINGS: Three portable AP supine views of the abdomen and pelvis at 0430 hours. Occasional EKG leads. No metallic or radiopaque foreign body identified in the abdomen or pelvis. Patchy nonspecific lung base opacity. Paucity of bowel gas. No acute osseous abnormality identified. IMPRESSION: 1. No retained metal foreign body in  the abdomen or pelvis. 2. Nonobstructed bowel-gas pattern. Nonspecific patchy lung base opacity. Electronically Signed   By: Odessa Fleming M.D.   On: 05/08/2023 05:11   CT HEAD WO CONTRAST ( )  Result Date: 05/08/2023 CLINICAL DATA:  Initial evaluation for mental status change, worsening headache. EXAM: CT HEAD WITHOUT CONTRAST TECHNIQUE: Contiguous axial images were obtained from the base of the skull through the vertex without intravenous contrast. RADIATION DOSE REDUCTION: This exam was performed according to the departmental dose-optimization program which includes automated exposure control, adjustment of the mA and/or kV according to patient size and/or use of iterative reconstruction technique. COMPARISON:  Prior CT from 10/01/2008. FINDINGS: Brain: Cerebral volume within normal limits. No acute intracranial hemorrhage. No acute large vessel territory infarct. No mass lesion. Ventricles normal size without hydrocephalus. 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 These measure up to 3-4 mm in maximal thickness. No significant mass effect or midline shift. Vascular: No abnormal hyperdense vessel. Calcified atherosclerosis present about the skull base. Skull: Scalp soft tissues demonstrate no acute finding. Calvarium intact. Sinuses/Orbits: Globes and orbital soft tissues within normal limits. Mild mucosal thickening present about the ethmoidal air cells and maxillary sinuses. Paranasal sinuses are otherwise clear. No mastoid effusion. Other: None. IMPRESSION: 1. 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 These measure up to 3-4 mm in maximal thickness. No significant mass effect or midline shift. 2. No other acute intracranial abnormality. Electronically Signed   By: Rise Mu M.D.   On: 05/08/2023 02:20   ECHOCARDIOGRAM COMPLETE  Result  Date: 05/07/2023    ECHOCARDIOGRAM REPORT   Patient Name:   CUBA SUMROW Date of Exam: 05/07/2023 Medical Rec #:  [id number]    Height:       69.0 in Accession #:    [id number]   Weight:       208.0 lb Date of Birth:  June 26, 1966    BSA:          2.101 m Patient Age:  57 years     BP:           126/59 mmHg Patient Gender: M            HR:           110 bpm. Exam Location:  Inpatient Procedure: 2D Echo, Cardiac Doppler and Color Doppler Indications:    CHF  History:        Patient has prior history of Echocardiogram examinations, most                 recent 09/02/2022. Signs/Symptoms:Chest Pain.  Sonographer:    Melton Krebs RDCS, FE, PE Referring Phys: [id number] Dolly Rias IMPRESSIONS  1. Left ventricular ejection fraction, by estimation, is 65 to 70%. The left ventricle has normal function. The left ventricle has no regional wall motion abnormalities. There is mild left ventricular hypertrophy. Left ventricular diastolic parameters were normal.  2. Right ventricular systolic function is normal. The right ventricular size is normal. There is moderately elevated pulmonary artery systolic pressure.  3. Left atrial size was mildly dilated.  4. Right atrial size was mild to moderately dilated.  5. The mitral valve is grossly normal. Mild mitral valve regurgitation. No evidence of mitral stenosis.  6. The aortic valve is tricuspid. Aortic valve regurgitation is not visualized. Aortic valve sclerosis is present, with no evidence of aortic valve stenosis.  7. The inferior vena cava is normal in size with greater than 50% respiratory variability, suggesting right atrial pressure of 3 mmHg. Comparison(s): No significant change from prior study. Conclusion(s)/Recommendation(s): Otherwise normal echocardiogram, with minor abnormalities described in the report. FINDINGS  Left Ventricle: Left ventricular ejection fraction, by estimation, is 65 to 70%. The left ventricle has normal function. The left ventricle has no regional  wall motion abnormalities. The left ventricular internal cavity size was normal in size. There is  mild left ventricular hypertrophy. Left ventricular diastolic parameters were normal. Right Ventricle: The right ventricular size is normal. Right vetricular wall thickness was not well visualized. Right ventricular systolic function is normal. There is moderately elevated pulmonary artery systolic pressure. The tricuspid regurgitant velocity is 3.63 m/s, and with an assumed right atrial pressure of 3 mmHg, the estimated right ventricular systolic pressure is 55.7 mmHg. Left Atrium: Left atrial size was mildly dilated. Right Atrium: Right atrial size was mild to moderately dilated. Pericardium: There is no evidence of pericardial effusion. Mitral Valve: The mitral valve is grossly normal. Mild mitral valve regurgitation. No evidence of mitral valve stenosis. Tricuspid Valve: The tricuspid valve is normal in structure. Tricuspid valve regurgitation is trivial. No evidence of tricuspid stenosis. Aortic Valve: The aortic valve is tricuspid. Aortic valve regurgitation is not visualized. Aortic valve sclerosis is present, with no evidence of aortic valve stenosis. Aortic valve mean gradient measures 12.0 mmHg. Aortic valve peak gradient measures 20.6 mmHg. Aortic valve area, by VTI measures 3.06 cm. Pulmonic Valve: The pulmonic valve was not well visualized. Pulmonic valve regurgitation is not visualized. No evidence of pulmonic stenosis. Aorta: The aortic root and ascending aorta are structurally normal, with no evidence of dilitation. Venous: The inferior vena cava is normal in size with greater than 50% respiratory variability, suggesting right atrial pressure of 3 mmHg. IAS/Shunts: The atrial septum is grossly normal.  LEFT VENTRICLE PLAX 2D LVIDd:         5.80 cm   Diastology LVIDs:         3.80 cm   LV e' medial:    9.57  cm/s LV PW:         1.20 cm   LV E/e' medial:  14.8 LV IVS:        0.90 cm   LV e' lateral:    10.30 cm/s LVOT diam:     2.20 cm   LV E/e' lateral: 13.8 LV SV:         105 LV SV Index:   50 LVOT Area:     3.80 cm  RIGHT VENTRICLE RV S prime:     22.90 cm/s TAPSE (M-mode): 3.0 cm LEFT ATRIUM             Index        RIGHT ATRIUM           Index LA Vol (A2C):   76.7 ml 36.51 ml/m  RA Area:     21.50 cm LA Vol (A4C):   51.7 ml 24.61 ml/m  RA Volume:   66.80 ml  31.80 ml/m LA Biplane Vol: 64.1 ml 30.51 ml/m  AORTIC VALVE AV Area (Vmax):    2.90 cm AV Area (Vmean):   2.73 cm AV Area (VTI):     3.06 cm AV Vmax:           227.00 cm/s AV Vmean:          163.000 cm/s AV VTI:            0.344 m AV Peak Grad:      20.6 mmHg AV Mean Grad:      12.0 mmHg LVOT Vmax:         173.00 cm/s LVOT Vmean:        117.000 cm/s LVOT VTI:          0.277 m LVOT/AV VTI ratio: 0.81  AORTA Ao Root diam: 3.30 cm Ao Asc diam:  3.00 cm MITRAL VALVE                TRICUSPID VALVE MV Area (PHT): 4.15 cm     TR Peak grad:   52.7 mmHg MV E velocity: 142.00 cm/s  TR Vmax:        363.00 cm/s MV A velocity: 169.00 cm/s MV E/A ratio:  0.84         SHUNTS                             Systemic VTI:  0.28 m                             Systemic Diam: 2.20 cm Jodelle Red MD Electronically signed by Jodelle Red MD Signature Date/Time: 05/07/2023/8:59:24 PM    Final    DG CHEST PORT 1 VIEW  Result Date: 06-05-23 CLINICAL DATA:  Shortness of breath. EXAM: PORTABLE CHEST 1 VIEW COMPARISON:  10/01/2008. FINDINGS: Heart is enlarged and the mediastinal contour is within normal limits. Lung volumes are low with mild airspace disease at the left lung base. No effusion or pneumothorax. No acute osseous abnormality. IMPRESSION: Mild atelectasis or infiltrate at the left lung base. Electronically Signed   By: Thornell Sartorius M.D.   On: 05-Jun-2023 22:29    Microbiology: Recent Results (from the past 240 hour(s))  Culture, blood (Routine X 2) w Reflex to ID Panel     Status: Abnormal   Collection Time: 05/08/23  8:15 AM    Specimen: BLOOD RIGHT ARM  Result Value Ref  Range Status   Specimen Description BLOOD RIGHT ARM  Final   Special Requests   Final    BOTTLES DRAWN AEROBIC AND ANAEROBIC Blood Culture adequate volume   Culture  Setup Time   Final    GRAM NEGATIVE RODS AEROBIC BOTTLE ONLY CRITICAL RESULT CALLED TO, READ BACK BY AND VERIFIED WITH: PHARMD CARLA JARDIN ON 05/08/23 @ 2216 BY DRT Performed at [facility], [address] [place], [place] [address]    Culture ESCHERICHIA COLI (A)  Final   Report Status 05/10/2023 FINAL  Final   Organism ID, Bacteria ESCHERICHIA COLI  Final      Susceptibility   Escherichia coli - MIC*    AMPICILLIN 4 SENSITIVE Sensitive     CEFEPIME <=0.12 SENSITIVE Sensitive     CEFTAZIDIME <=1 SENSITIVE Sensitive     CEFTRIAXONE <=0.25 SENSITIVE Sensitive     CIPROFLOXACIN <=0.25 SENSITIVE Sensitive     GENTAMICIN <=1 SENSITIVE Sensitive     IMIPENEM <=0.25 SENSITIVE Sensitive     TRIMETH/SULFA <=20 SENSITIVE Sensitive     AMPICILLIN/SULBACTAM <=2 SENSITIVE Sensitive     PIP/TAZO <=4 SENSITIVE Sensitive ug/mL    * ESCHERICHIA COLI  Culture, blood (Routine X 2) w Reflex to ID Panel     Status: Abnormal   Collection Time: 05/08/23  8:15 AM   Specimen: BLOOD RIGHT ARM  Result Value Ref Range Status   Specimen Description BLOOD RIGHT ARM  Final   Special Requests   Final    BOTTLES DRAWN AEROBIC AND ANAEROBIC Blood Culture adequate volume   Culture  Setup Time   Final    GRAM NEGATIVE RODS AEROBIC BOTTLE ONLY CRITICAL RESULT CALLED TO, READ BACK BY AND VERIFIED WITH: PHARMD CARLA JARDIN ON 05/08/23 @ 2216 BY DRT    Culture (A)  Final    ESCHERICHIA COLI SUSCEPTIBILITIES PERFORMED ON PREVIOUS CULTURE WITHIN THE LAST 5 DAYS. Performed at [facility], [address] [place], [place] [address]    Report Status 05/10/2023 FINAL  Final  Blood Culture ID Panel (Reflexed)     Status: Abnormal   Collection Time: 05/08/23  8:15 AM  Result Value Ref Range  Status   Enterococcus faecalis NOT DETECTED NOT DETECTED Final   Enterococcus Faecium NOT DETECTED NOT DETECTED Final   Listeria monocytogenes NOT DETECTED NOT DETECTED Final   Staphylococcus species NOT DETECTED NOT DETECTED Final   Staphylococcus aureus (BCID) NOT DETECTED NOT DETECTED Final   Staphylococcus epidermidis NOT DETECTED NOT DETECTED Final   Staphylococcus lugdunensis NOT DETECTED NOT DETECTED Final   Streptococcus species NOT DETECTED NOT DETECTED Final   Streptococcus agalactiae NOT DETECTED NOT DETECTED Final   Streptococcus pneumoniae NOT DETECTED NOT DETECTED Final   Streptococcus pyogenes NOT DETECTED NOT DETECTED Final   A.calcoaceticus-baumannii NOT DETECTED NOT DETECTED Final   Bacteroides fragilis NOT DETECTED NOT DETECTED Final   Enterobacterales DETECTED (A) NOT DETECTED Final    Comment: Enterobacterales represent a large order of gram negative bacteria, not a single organism. CRITICAL RESULT CALLED TO, READ BACK BY AND VERIFIED WITH: PHARMD CARLA JARDIN ON 05/08/23 @ 2216 BY DRT    Enterobacter cloacae complex NOT DETECTED NOT DETECTED Final   Escherichia coli DETECTED (A) NOT DETECTED Final    Comment: CRITICAL RESULT CALLED TO, READ BACK BY AND VERIFIED WITH: PHARMD CARLA JARDIN ON 05/08/23 @ 2216 BY DRT    Klebsiella aerogenes NOT DETECTED NOT DETECTED Final   Klebsiella oxytoca NOT DETECTED NOT DETECTED Final  Klebsiella pneumoniae NOT DETECTED NOT DETECTED Final   Proteus species NOT DETECTED NOT DETECTED Final   Salmonella species NOT DETECTED NOT DETECTED Final   Serratia marcescens NOT DETECTED NOT DETECTED Final   Haemophilus influenzae NOT DETECTED NOT DETECTED Final   Neisseria meningitidis NOT DETECTED NOT DETECTED Final   Pseudomonas aeruginosa NOT DETECTED NOT DETECTED Final   Stenotrophomonas maltophilia NOT DETECTED NOT DETECTED Final   Candida albicans NOT DETECTED NOT DETECTED Final   Candida auris NOT DETECTED NOT DETECTED Final    Candida glabrata NOT DETECTED NOT DETECTED Final   Candida krusei NOT DETECTED NOT DETECTED Final   Candida parapsilosis NOT DETECTED NOT DETECTED Final   Candida tropicalis NOT DETECTED NOT DETECTED Final   Cryptococcus neoformans/gattii NOT DETECTED NOT DETECTED Final   CTX-M ESBL NOT DETECTED NOT DETECTED Final   Carbapenem resistance IMP NOT DETECTED NOT DETECTED Final   Carbapenem resistance KPC NOT DETECTED NOT DETECTED Final   Carbapenem resistance NDM NOT DETECTED NOT DETECTED Final   Carbapenem resist OXA 48 LIKE NOT DETECTED NOT DETECTED Final   Carbapenem resistance VIM NOT DETECTED NOT DETECTED Final    Comment: Performed at [facility], [address] [place], [place] [address]    Time spent: 25 minutes  Signed: Miguel Rota, MD 22-May-2023

## 2023-05-30 NOTE — Plan of Care (Signed)
  Problem: Pain Managment: Goal: General experience of comfort will improve Outcome: Progressing   Problem: Safety: Goal: Ability to remain free from injury will improve Outcome: Progressing   

## 2023-05-30 NOTE — Plan of Care (Signed)

## 2023-05-30 DEATH — deceased

## 2024-02-20 NOTE — Telephone Encounter (Signed)
 error
